# Patient Record
Sex: Female | Born: 1970 | Race: White | Hispanic: No | Marital: Married | Smoking: Never smoker
Health system: Southern US, Community
[De-identification: ages and names within clinical notes are randomized; demographics above are authoritative.]

## PROBLEM LIST (undated history)

## (undated) DIAGNOSIS — K7689 Other specified diseases of liver: Secondary | ICD-10-CM

## (undated) DIAGNOSIS — G43909 Migraine, unspecified, not intractable, without status migrainosus: Secondary | ICD-10-CM

## (undated) DIAGNOSIS — N83209 Unspecified ovarian cyst, unspecified side: Secondary | ICD-10-CM

## (undated) DIAGNOSIS — E781 Pure hyperglyceridemia: Secondary | ICD-10-CM

## (undated) HISTORY — DX: Other specified diseases of liver: K76.89

## (undated) HISTORY — PX: WISDOM TOOTH EXTRACTION: SHX21

## (undated) HISTORY — DX: Unspecified ovarian cyst, unspecified side: N83.209

## (undated) HISTORY — DX: Migraine, unspecified, not intractable, without status migrainosus: G43.909

## (undated) HISTORY — DX: Pure hyperglyceridemia: E78.1

## (undated) SURGERY — Surgical Case
Anesthesia: *Unknown

---

## 1997-12-28 ENCOUNTER — Ambulatory Visit (HOSPITAL_COMMUNITY): Admission: RE | Admit: 1997-12-28 | Discharge: 1997-12-28 | Payer: Self-pay | Admitting: *Deleted

## 1998-02-10 ENCOUNTER — Other Ambulatory Visit: Admission: RE | Admit: 1998-02-10 | Discharge: 1998-02-10 | Payer: Self-pay | Admitting: *Deleted

## 1998-11-24 ENCOUNTER — Inpatient Hospital Stay (HOSPITAL_COMMUNITY): Admission: AD | Admit: 1998-11-24 | Discharge: 1998-11-26 | Payer: Self-pay | Admitting: *Deleted

## 1999-01-02 ENCOUNTER — Other Ambulatory Visit: Admission: RE | Admit: 1999-01-02 | Discharge: 1999-01-02 | Payer: Self-pay | Admitting: *Deleted

## 1999-12-24 ENCOUNTER — Other Ambulatory Visit: Admission: RE | Admit: 1999-12-24 | Discharge: 1999-12-24 | Payer: Self-pay | Admitting: *Deleted

## 2001-01-14 ENCOUNTER — Other Ambulatory Visit: Admission: RE | Admit: 2001-01-14 | Discharge: 2001-01-14 | Payer: Self-pay | Admitting: *Deleted

## 2002-02-08 ENCOUNTER — Other Ambulatory Visit: Admission: RE | Admit: 2002-02-08 | Discharge: 2002-02-08 | Payer: Self-pay | Admitting: *Deleted

## 2002-12-22 ENCOUNTER — Other Ambulatory Visit: Admission: RE | Admit: 2002-12-22 | Discharge: 2002-12-22 | Payer: Self-pay | Admitting: *Deleted

## 2003-12-26 ENCOUNTER — Other Ambulatory Visit: Admission: RE | Admit: 2003-12-26 | Discharge: 2003-12-26 | Payer: Self-pay | Admitting: Obstetrics and Gynecology

## 2005-01-10 ENCOUNTER — Other Ambulatory Visit: Admission: RE | Admit: 2005-01-10 | Discharge: 2005-01-10 | Payer: Self-pay | Admitting: Obstetrics and Gynecology

## 2009-05-27 ENCOUNTER — Encounter: Admission: RE | Admit: 2009-05-27 | Discharge: 2009-05-27 | Payer: Self-pay | Admitting: Diagnostic Neuroimaging

## 2011-05-17 ENCOUNTER — Ambulatory Visit (HOSPITAL_COMMUNITY)
Admission: RE | Admit: 2011-05-17 | Discharge: 2011-05-17 | Disposition: A | Discharge status: Home / Self Care | Payer: BC Managed Care – PPO | Source: Ambulatory Visit | Attending: Obstetrics and Gynecology | Admitting: Obstetrics and Gynecology

## 2011-05-17 ENCOUNTER — Other Ambulatory Visit (HOSPITAL_COMMUNITY): Payer: Self-pay | Admitting: Obstetrics and Gynecology

## 2011-05-17 DIAGNOSIS — D869 Sarcoidosis, unspecified: Secondary | ICD-10-CM

## 2016-11-14 ENCOUNTER — Other Ambulatory Visit: Payer: Self-pay | Admitting: Obstetrics and Gynecology

## 2016-11-14 DIAGNOSIS — R928 Other abnormal and inconclusive findings on diagnostic imaging of breast: Secondary | ICD-10-CM

## 2016-11-15 ENCOUNTER — Ambulatory Visit
Admission: RE | Admit: 2016-11-15 | Discharge: 2016-11-15 | Disposition: A | Discharge status: Home / Self Care | Payer: BLUE CROSS/BLUE SHIELD | Source: Ambulatory Visit | Attending: Obstetrics and Gynecology | Admitting: Obstetrics and Gynecology

## 2016-11-15 ENCOUNTER — Other Ambulatory Visit: Payer: Self-pay | Admitting: Obstetrics and Gynecology

## 2016-11-15 DIAGNOSIS — R928 Other abnormal and inconclusive findings on diagnostic imaging of breast: Secondary | ICD-10-CM

## 2016-11-19 ENCOUNTER — Ambulatory Visit
Admission: RE | Admit: 2016-11-19 | Discharge: 2016-11-19 | Disposition: A | Discharge status: Home / Self Care | Payer: BLUE CROSS/BLUE SHIELD | Source: Ambulatory Visit | Attending: Obstetrics and Gynecology | Admitting: Obstetrics and Gynecology

## 2016-12-11 ENCOUNTER — Emergency Department (HOSPITAL_COMMUNITY)
Admission: EM | Admit: 2016-12-11 | Discharge: 2016-12-11 | Disposition: A | Discharge status: Home / Self Care | Payer: BLUE CROSS/BLUE SHIELD | Attending: Emergency Medicine | Admitting: Emergency Medicine

## 2016-12-11 ENCOUNTER — Encounter (HOSPITAL_COMMUNITY): Payer: Self-pay

## 2016-12-11 DIAGNOSIS — R112 Nausea with vomiting, unspecified: Secondary | ICD-10-CM | POA: Insufficient documentation

## 2016-12-11 DIAGNOSIS — E860 Dehydration: Secondary | ICD-10-CM | POA: Insufficient documentation

## 2016-12-11 DIAGNOSIS — Z79899 Other long term (current) drug therapy: Secondary | ICD-10-CM | POA: Insufficient documentation

## 2016-12-11 DIAGNOSIS — R197 Diarrhea, unspecified: Secondary | ICD-10-CM | POA: Diagnosis not present

## 2016-12-11 LAB — URINALYSIS, ROUTINE W REFLEX MICROSCOPIC
BILIRUBIN URINE: NEGATIVE
Glucose, UA: NEGATIVE mg/dL
Ketones, ur: 20 mg/dL — AB
LEUKOCYTES UA: NEGATIVE
NITRITE: NEGATIVE
PROTEIN: NEGATIVE mg/dL
Specific Gravity, Urine: 1.01 (ref 1.005–1.030)
pH: 5 (ref 5.0–8.0)

## 2016-12-11 LAB — COMPREHENSIVE METABOLIC PANEL
ALBUMIN: 4.2 g/dL (ref 3.5–5.0)
ALT: 20 U/L (ref 14–54)
AST: 31 U/L (ref 15–41)
Alkaline Phosphatase: 54 U/L (ref 38–126)
Anion gap: 11 (ref 5–15)
BUN: 10 mg/dL (ref 6–20)
CHLORIDE: 103 mmol/L (ref 101–111)
CO2: 22 mmol/L (ref 22–32)
CREATININE: 0.87 mg/dL (ref 0.44–1.00)
Calcium: 8.4 mg/dL — ABNORMAL LOW (ref 8.9–10.3)
GFR calc non Af Amer: >60 mL/min (ref >60)
GLUCOSE: 90 mg/dL (ref 65–99)
Potassium: 3.5 mmol/L (ref 3.5–5.1)
SODIUM: 136 mmol/L (ref 135–145)
Total Bilirubin: 1 mg/dL (ref 0.3–1.2)
Total Protein: 7.1 g/dL (ref 6.5–8.1)

## 2016-12-11 LAB — CBC WITH DIFFERENTIAL/PLATELET
BASOS PCT: 0 %
Basophils Absolute: 0 10*3/uL (ref 0.0–0.1)
EOS ABS: 0 10*3/uL (ref 0.0–0.7)
EOS PCT: 0 %
HCT: 39.2 % (ref 36.0–46.0)
Hemoglobin: 13.8 g/dL (ref 12.0–15.0)
Lymphocytes Relative: 2 %
Lymphs Abs: 0.1 10*3/uL — ABNORMAL LOW (ref 0.7–4.0)
MCH: 30.1 pg (ref 26.0–34.0)
MCHC: 35.2 g/dL (ref 30.0–36.0)
MCV: 85.4 fL (ref 78.0–100.0)
MONO ABS: 0.1 10*3/uL (ref 0.1–1.0)
MONOS PCT: 1 %
Neutro Abs: 4.9 10*3/uL (ref 1.7–7.7)
Neutrophils Relative %: 97 %
PLATELETS: 138 10*3/uL — AB (ref 150–400)
RBC: 4.59 MIL/uL (ref 3.87–5.11)
RDW: 12.4 % (ref 11.5–15.5)
WBC: 5.1 10*3/uL (ref 4.0–10.5)

## 2016-12-11 LAB — CBG MONITORING, ED: Glucose-Capillary: 93 mg/dL (ref 65–99)

## 2016-12-11 LAB — LIPASE, BLOOD: LIPASE: 22 U/L (ref 11–51)

## 2016-12-11 LAB — I-STAT BETA HCG BLOOD, ED (MC, WL, AP ONLY)

## 2016-12-11 MED ORDER — ONDANSETRON HCL 4 MG/2ML IJ SOLN
4.0000 mg | Freq: Once | INTRAMUSCULAR | Status: AC
  Administered 2016-12-11: 4 mg via INTRAVENOUS
  Filled 2016-12-11: qty 2

## 2016-12-11 MED ORDER — ONDANSETRON HCL 4 MG/2ML IJ SOLN: 4.0000 mg | Freq: Once | INTRAMUSCULAR | Status: DC | PRN

## 2016-12-11 MED ORDER — ONDANSETRON 4 MG PO TBDP: 4.0000 mg | ORAL_TABLET | Freq: Three times a day (TID) | ORAL | 0 refills | Status: DC | PRN

## 2016-12-11 MED ORDER — SODIUM CHLORIDE 0.9 % IV BOLUS (SEPSIS)
1000.0000 mL | Freq: Once | INTRAVENOUS | Status: AC
  Administered 2016-12-11: 1000 mL via INTRAVENOUS

## 2016-12-11 MED ORDER — KETOROLAC TROMETHAMINE 30 MG/ML IJ SOLN
30.0000 mg | Freq: Once | INTRAMUSCULAR | Status: AC
  Administered 2016-12-11: 30 mg via INTRAVENOUS
  Filled 2016-12-11: qty 1

## 2016-12-11 MED ORDER — DICYCLOMINE HCL 20 MG PO TABS: 20.0000 mg | ORAL_TABLET | Freq: Two times a day (BID) | ORAL | 0 refills | Status: DC

## 2016-12-11 NOTE — ED Notes (Signed)
PT DISCHARGED. INSTRUCTIONS AND PRESCRIPTIONS GIVEN. AAOX4. PT IN NO APPARENT DISTRESS OR PAIN. THE OPPORTUNITY TO ASK QUESTIONS WAS PROVIDED. 

## 2016-12-11 NOTE — ED Provider Notes (Signed)
[facility] DEPT Provider Note   CSN: [id number] Arrival date & time: 12/11/16  1124     History   Chief Complaint Chief Complaint  Patient presents with  . Emesis  . Diarrhea    HPI Grace Beck is a 46 y.o. female.  HPI   Grace Beck is a 46 y.o. female, patient with no pertinent past medical history, presenting to the ED with N/V/D. Nausea beginning two days ago. Vomiting and diarrhea beginning yesterday. Today had sudden onset of multiple emesis and diarrhea with some stool incontinence.  No recent travel. No known contact with persons who have traveled outside the country. No known sick contacts. No recent antibiotic use. No recent hospitalization or contact with hospitalized/nursing home patients. No recent questionable intake. Poor oral intake last two days.   Denies abdominal pain, chest pain, shortness of breath, hematochezia/melena, or any other complaints.      History reviewed. No pertinent past medical history.  There are no active problems to display for this patient.   History reviewed. No pertinent surgical history.  OB History    No data available       Home Medications    Prior to Admission medications   Medication Sig Start Date End Date Taking? Authorizing Provider  calcium gluconate 500 MG tablet Take 1 tablet by mouth 2 (two) times daily.   Yes [provider]  Cholecalciferol (VITAMIN D) 2000 units CAPS Take 2,000 Units by mouth daily.   Yes [provider]  Multiple Vitamins-Minerals (MULTIVITAMIN WITH MINERALS) tablet Take 1 tablet by mouth daily.   Yes [provider]  dicyclomine (BENTYL) 20 MG tablet Take 1 tablet (20 mg total) by mouth 2 (two) times daily. 12/11/16   Joy, Shawn C, PA-C  ondansetron (ZOFRAN ODT) 4 MG disintegrating tablet Take 1 tablet (4 mg total) by mouth every 8 (eight) hours as needed for nausea or vomiting. 12/11/16   Lorayne Bender, PA-C    Family History History reviewed. No  pertinent family history.  Social History Social History  Substance Use Topics  . Smoking status: Never Smoker  . Smokeless tobacco: Never Used  . Alcohol use No     Allergies   Patient has no known allergies.   Review of Systems Review of Systems  Constitutional: Positive for activity change, appetite change, diaphoresis and fever.  Respiratory: Negative for cough and shortness of breath.   Cardiovascular: Negative for chest pain.  Gastrointestinal: Positive for diarrhea, nausea and vomiting. Negative for abdominal pain and blood in stool.  Genitourinary: Negative for difficulty urinating.  Skin: Positive for pallor.  Neurological: Positive for weakness (generalized) and light-headedness. Negative for syncope.  All other systems reviewed and are negative.    Physical Exam Updated Vital Signs BP 117/74 (BP Location: Left Arm)   Pulse (!) 121   Temp (!) 100.8 F (38.2 C) (Oral)   Resp 17   Ht 5\' 2"  (1.575 m)   Wt 56.2 kg (124 lb)   SpO2 97%   BMI 22.68 kg/m   Physical Exam  Constitutional: She appears well-developed and well-nourished.  HENT:  Head: Normocephalic and atraumatic.  Eyes: Conjunctivae are normal.  Neck: Neck supple.  Cardiovascular: Regular rhythm, normal heart sounds and intact distal pulses.  Tachycardia present.   Pulmonary/Chest: Effort normal and breath sounds normal. No respiratory distress.  Abdominal: Soft. Bowel sounds are increased. There is no tenderness. There is no guarding.  Musculoskeletal: She exhibits no edema.  Lymphadenopathy:  She has no cervical adenopathy.  Neurological: She is alert.  Skin: Skin is warm and dry. She is not diaphoretic. There is pallor.  Psychiatric: She has a normal mood and affect. Her behavior is normal.  Nursing note and vitals reviewed.    ED Treatments / Results  Labs (all labs ordered are listed, but only abnormal results are displayed) Labs Reviewed  COMPREHENSIVE METABOLIC PANEL - Abnormal;  Notable for the following:       Result Value   Calcium 8.4 (*)    All other components within normal limits  URINALYSIS, ROUTINE W REFLEX MICROSCOPIC - Abnormal; Notable for the following:    APPearance HAZY (*)    Hgb urine dipstick SMALL (*)    Ketones, ur 20 (*)    Bacteria, UA RARE (*)    Squamous Epithelial / LPF 0-5 (*)    All other components within normal limits  CBC WITH DIFFERENTIAL/PLATELET - Abnormal; Notable for the following:    Platelets 138 (*)    Lymphs Abs 0.1 (*)    All other components within normal limits  GASTROINTESTINAL PANEL BY PCR, STOOL (REPLACES STOOL CULTURE)  LIPASE, BLOOD  I-STAT BETA HCG BLOOD, ED ([facility], [facility], [facility] ONLY)  CBG MONITORING, ED    EKG  EKG Interpretation None       Radiology No results found.  Procedures Procedures (including critical care time)  Medications Ordered in ED Medications  sodium chloride 0.9 % bolus 1,000 mL (0 mLs Intravenous Stopped 12/11/16 1736)    Followed by  sodium chloride 0.9 % bolus 1,000 mL (0 mLs Intravenous Stopped 12/11/16 1736)  ondansetron (ZOFRAN) injection 4 mg (4 mg Intravenous Given 12/11/16 1400)  ketorolac (TORADOL) 30 MG/ML injection 30 mg (30 mg Intravenous Given 12/11/16 1400)     Initial Impression / Assessment and Plan / ED Course  I have reviewed the triage vital signs and the nursing notes.  Pertinent labs & imaging results that were available during my care of the patient were reviewed by me and considered in my medical decision making (see chart for details).  Clinical Course as of Dec 12 2235  Wed Dec 11, 2016  1515 Patient states she is beginning to feel better. No instances of vomiting or diarrhea since initiation of treatment.  [SJ]  6789 Patient states her normal SBP is in the low to mid 90s. BP: (!) 99/59 [SJ]  1655 Patient's nausea has resolved completely. Continues to deny instances of vomiting or diarrhea here in the ED. Tolerating PO fluids.   [SJ]    Clinical Course User  Index [SJ] Joy, Shawn C, PA-C    Patient presents with nausea, vomiting, and diarrhea. Evidence of dehydration on UA. Significant improvement voiced by patient and observed on repeat evaluations. Improvement in patient's tachycardia during ED course. Able to tolerate PO. Patient is an otherwise healthy female with no risk factors and thus shared decision making was used regarding discharge with continued PO hydration at home. Ambulated without difficulty or assistance. PCP follow up as needed. The patient and patient's husband at the bedside were given instructions for home care as well as strict return precautions. Both parties voice understanding of these instructions, accept the plan, and are comfortable with discharge.  Vitals:   12/11/16 1400 12/11/16 1500 12/11/16 1523 12/11/16 1700  BP: 106/63 (!) 92/56 (!) 99/59 (!) 99/53  Pulse: (!) 124 (!) 115 (!) 114 (!) 111  Resp: (!) 22 17 18  (!) 22  Temp:   99.2  F (37.3 C)   TempSrc:   Oral   SpO2: 95% 90% 92% 96%  Weight:      Height:         Final Clinical Impressions(s) / ED Diagnoses   Final diagnoses:  Nausea vomiting and diarrhea  Dehydration    New Prescriptions Discharge Medication List as of 12/11/2016  5:23 PM    START taking these medications   Details  dicyclomine (BENTYL) 20 MG tablet Take 1 tablet (20 mg total) by mouth 2 (two) times daily., Starting Wed 12/11/2016, Print    ondansetron (ZOFRAN ODT) 4 MG disintegrating tablet Take 1 tablet (4 mg total) by mouth every 8 (eight) hours as needed for nausea or vomiting., Starting Wed 12/11/2016, Print         Joy, Burke C, PA-C 12/11/16 2239    Tegeler, Gwenyth Allegra, MD 12/12/16 [phone]

## 2016-12-11 NOTE — Discharge Instructions (Signed)
Your lab results are encouraging overall, but with some signs of dehydration.   Hand washing: Wash your hands throughout the day, but especially before and after touching the face, using the restroom, sneezing, coughing, or touching surfaces that have been coughed or sneezed upon. Hydration: Symptoms will be intensified and complicated by dehydration. Dehydration can also extend the duration of symptoms. Drink plenty of fluids and get plenty of rest. You should be drinking at least half a liter of water an hour to stay hydrated. Electrolyte drinks are also encouraged. You should be drinking enough fluids to make your urine light yellow, almost clear. If this is not the case, you are not drinking enough water. Please note that some of the treatments indicated below will not be effective if you are not adequately hydrated. Pain or fever: Ibuprofen, Naproxen, or Tylenol for pain or fever.  Nausea/vomiting: Use the Zofran for nausea or vomiting.  Bentyl: Bentyl can be used as needed for abdominal cramping. Follow up: Follow up with a primary care provider, as needed, for any future management of this issue. Return: Return to the ED should any symptoms worsen or new concerning symptoms develop.

## 2016-12-11 NOTE — ED Triage Notes (Signed)
PT RECEIVED VIA EMS FOR N/V/D. PER EMS, PT WAS ATTENDING A [employer] ORIENTATION WITH HER SON, WHEN SHE BECAME DIZZY AND LIGHT HEADED AND BEGAN TO VOMIT WITH DIARRHEA. PT STS SHE HAD AN EPISODE OF DIARRHEA THIS AM BEFORE GOING. DURING THE EPISODE, SHE ALSO BEGAN TO HYPERVENTILATE.

## 2016-12-17 ENCOUNTER — Ambulatory Visit
Admission: RE | Admit: 2016-12-17 | Discharge: 2016-12-17 | Disposition: A | Discharge status: Home / Self Care | Payer: BLUE CROSS/BLUE SHIELD | Source: Ambulatory Visit | Attending: Family Medicine | Admitting: Family Medicine

## 2016-12-17 ENCOUNTER — Encounter: Payer: Self-pay | Admitting: Family Medicine

## 2016-12-17 ENCOUNTER — Ambulatory Visit (INDEPENDENT_AMBULATORY_CARE_PROVIDER_SITE_OTHER): Payer: BLUE CROSS/BLUE SHIELD | Admitting: Family Medicine

## 2016-12-17 VITALS — BP 118/88 | HR 80 | Temp 98.0°F | Resp 14 | Ht 62.0 in | Wt 122.0 lb

## 2016-12-17 DIAGNOSIS — R519 Headache, unspecified: Secondary | ICD-10-CM

## 2016-12-17 DIAGNOSIS — R51 Headache: Principal | ICD-10-CM

## 2016-12-17 MED ORDER — HYDROCODONE-ACETAMINOPHEN 7.5-325 MG PO TABS: 1.0000 | ORAL_TABLET | Freq: Four times a day (QID) | ORAL | 0 refills | Status: DC | PRN

## 2016-12-17 MED ORDER — PROMETHAZINE HCL 25 MG PO TABS: 25.0000 mg | ORAL_TABLET | Freq: Three times a day (TID) | ORAL | 0 refills | Status: DC | PRN

## 2016-12-17 MED ORDER — KETOROLAC TROMETHAMINE 60 MG/2ML IM SOLN
60.0000 mg | Freq: Once | INTRAMUSCULAR | Status: AC
  Administered 2016-12-17: 60 mg via INTRAMUSCULAR

## 2016-12-17 MED ORDER — PROMETHAZINE HCL 25 MG/ML IJ SOLN
25.0000 mg | Freq: Once | INTRAMUSCULAR | Status: AC
  Administered 2016-12-17: 25 mg via INTRAMUSCULAR

## 2016-12-17 NOTE — Progress Notes (Signed)
Subjective:    Patient ID: Grace Beck, female    DOB: 02-28-1971, 46 y.o.   MRN: [id number]  HPI Patient is a 46 y/o WF with PMH of migraines.  They occur seldomly and infrequently.  Approximately 2 weeks ago she was bitten on the left lower quadrant by a tick. There is no visible rash in that area. 9 days ago, the patient became nauseated with severe headache. She did not experience any vomiting. She did not experience any diarrhea. Headache was frontal in location and pressure-like in intensity. Gradually worsened throughout the week. On Wednesday, the patient became extremely nauseated with a severe headache. Went to the restroom where she became tremulous experience lightheadedness felt faint and almost passed out. EMS was called. En route to the hospital, the patient experienced uncontrollable vomiting and diarrhea. Was seen at the hospital and was given IV fluids and symptoms improved. On Thursday, the severe headache and the nausea had returned. She continues to have a daily constant intractable headache. It is frontal in location. She is now reporting some stiffness in her neck which she describes like a crick in her neck. When the headache worsens, she becomes extremely nauseated. This morning the headache was so severe she began vomiting again. No aura. She denies any photophobia.  She denies any seizure-like activity. She denies any neurologic deficit. She denies any rash. Headache and nausea are definitely linked together. She was started on doxycycline by another physician yesterday. CBC was unremarkable. CMP was significant only for a slight elevation and ALT. [facility] spotted fever titers are pending. No past medical history on file. No past surgical history on file. Current Outpatient Prescriptions on File Prior to Visit  Medication Sig Dispense Refill  . ondansetron (ZOFRAN ODT) 4 MG disintegrating tablet Take 1 tablet (4 mg total) by mouth every 8 (eight) hours as needed  for nausea or vomiting. 20 tablet 0  . calcium gluconate 500 MG tablet Take 1 tablet by mouth 2 (two) times daily.    . Cholecalciferol (VITAMIN D) 2000 units CAPS Take 2,000 Units by mouth daily.    Marland Kitchen dicyclomine (BENTYL) 20 MG tablet Take 1 tablet (20 mg total) by mouth 2 (two) times daily. (Patient not taking: Reported on 12/17/2016) 20 tablet 0  . Multiple Vitamins-Minerals (MULTIVITAMIN WITH MINERALS) tablet Take 1 tablet by mouth daily.     No current facility-administered medications on file prior to visit.    No Known Allergies Social History   Social History  . Marital status: Married    Spouse name: N/A  . Number of children: N/A  . Years of education: N/A   Occupational History  . Not on file.   Social History Main Topics  . Smoking status: Never Smoker  . Smokeless tobacco: Never Used  . Alcohol use No  . Drug use: No  . Sexual activity: Not on file   Other Topics Concern  . Not on file   Social History Narrative  . No narrative on file      Review of Systems  All other systems reviewed and are negative.      Objective:   Physical Exam  Constitutional: She is oriented to person, place, and time. She appears well-developed. She is active.  Non-toxic appearance. She does not have a sickly appearance. She appears ill.  HENT:  Right Ear: Tympanic membrane and ear canal normal.  Left Ear: Tympanic membrane and ear canal normal.  Nose: No mucosal edema or rhinorrhea.  Right sinus exhibits frontal sinus tenderness. Left sinus exhibits frontal sinus tenderness.  Eyes: Conjunctivae and EOM are normal. Pupils are equal, round, and reactive to light.  Cardiovascular: Normal rate, regular rhythm and normal heart sounds.   Pulmonary/Chest: Effort normal and breath sounds normal. No respiratory distress. She has no wheezes. She has no rales.  Abdominal: Soft. Bowel sounds are normal. She exhibits no distension. There is no tenderness. There is no rebound and no  guarding.  Neurological: She is alert and oriented to person, place, and time. She has normal reflexes. She displays normal reflexes. No cranial nerve deficit. She exhibits normal muscle tone. Coordination normal.  Vitals reviewed.         Assessment & Plan:  Acute intractable headache, unspecified headache type - Plan: CT Head Wo Contrast  Differential diagnosis includes status migrainosus (however this would not explain the diarrhea or the fevers that she experienced on Wednesday and Thursday), tickborne illness like [facility] spotted fever and she is already on doxycycline, viral meningitis, or causes of increased intracranial pressure. I will obtain a noncontrast CT scan to rule out causes of increased intracranial pressure. I will treat the patient with Phenergan 25 mg every 6 hours scheduled to help cover for migraines as well as control her nausea. I will give her hydrocodone 5/325 one by mouth every 4 hours when necessary headache. I'll allow 24-48 hours to see if doxycycline will improve the condition. Overweight [place] spotted fever titers. Viral meningitis should resolve spontaneously and require supportive care. In addition, patient will receive 60 mg of Toradol IM and 25 mg of Phenergan IM 1 now

## 2016-12-17 NOTE — Addendum Note (Signed)
Addended by: Shary Decamp B on: 12/17/2016 02:36 PM   Modules accepted: Orders

## 2016-12-19 ENCOUNTER — Other Ambulatory Visit: Payer: Self-pay | Admitting: Family Medicine

## 2016-12-23 ENCOUNTER — Other Ambulatory Visit: Payer: Self-pay | Admitting: Family Medicine

## 2016-12-23 ENCOUNTER — Ambulatory Visit (HOSPITAL_COMMUNITY)
Admission: RE | Admit: 2016-12-23 | Discharge: 2016-12-23 | Disposition: A | Discharge status: Home / Self Care | Payer: BLUE CROSS/BLUE SHIELD | Source: Ambulatory Visit | Attending: Family Medicine | Admitting: Family Medicine

## 2016-12-23 ENCOUNTER — Encounter: Payer: Self-pay | Admitting: Family Medicine

## 2016-12-23 ENCOUNTER — Ambulatory Visit (INDEPENDENT_AMBULATORY_CARE_PROVIDER_SITE_OTHER): Payer: BLUE CROSS/BLUE SHIELD | Admitting: Family Medicine

## 2016-12-23 VITALS — BP 128/72 | HR 78 | Temp 98.6°F | Resp 14 | Ht 62.0 in | Wt 124.0 lb

## 2016-12-23 DIAGNOSIS — N83201 Unspecified ovarian cyst, right side: Secondary | ICD-10-CM | POA: Insufficient documentation

## 2016-12-23 DIAGNOSIS — G43A1 Cyclical vomiting, intractable: Secondary | ICD-10-CM | POA: Diagnosis present

## 2016-12-23 DIAGNOSIS — G43A0 Cyclical vomiting, not intractable: Secondary | ICD-10-CM

## 2016-12-23 DIAGNOSIS — R1115 Cyclical vomiting syndrome unrelated to migraine: Secondary | ICD-10-CM

## 2016-12-23 DIAGNOSIS — K7689 Other specified diseases of liver: Secondary | ICD-10-CM | POA: Diagnosis not present

## 2016-12-23 LAB — COMPREHENSIVE METABOLIC PANEL
ALK PHOS: 54 U/L (ref 33–115)
ALT: 21 U/L (ref 6–29)
AST: 18 U/L (ref 10–30)
Albumin: 4.2 g/dL (ref 3.6–5.1)
BUN: 9 mg/dL (ref 7–25)
CALCIUM: 9.1 mg/dL (ref 8.6–10.2)
CHLORIDE: 104 mmol/L (ref 98–110)
CO2: 26 mmol/L (ref 20–31)
Creat: 0.72 mg/dL (ref 0.50–1.10)
GLUCOSE: 84 mg/dL (ref 70–99)
POTASSIUM: 4.5 mmol/L (ref 3.5–5.3)
Sodium: 136 mmol/L (ref 135–146)
Total Bilirubin: 0.4 mg/dL (ref 0.2–1.2)
Total Protein: 7.1 g/dL (ref 6.1–8.1)

## 2016-12-23 LAB — CBC
HEMATOCRIT: 42.4 % (ref 35.0–45.0)
Hemoglobin: 15 g/dL (ref 12.0–15.0)
MCH: 30.9 pg (ref 27.0–33.0)
MCHC: 35.4 g/dL (ref 32.0–36.0)
MCV: 87.2 fL (ref 80.0–100.0)
PLATELETS: 222 10*3/uL (ref 140–400)
RBC: 4.86 MIL/uL (ref 3.80–5.10)
RDW: 12.7 % (ref 11.0–15.0)
WBC: 6.2 10*3/uL (ref 3.8–10.8)

## 2016-12-23 LAB — LIPASE: LIPASE: 46 U/L (ref 7–60)

## 2016-12-23 MED ORDER — IOPAMIDOL (ISOVUE-300) INJECTION 61%
INTRAVENOUS | Status: AC
  Administered 2016-12-23: 80 mL
  Filled 2016-12-23: qty 100

## 2016-12-23 MED ORDER — IOPAMIDOL (ISOVUE-300) INJECTION 61%
INTRAVENOUS | Status: AC
  Filled 2016-12-23: qty 30

## 2016-12-23 MED ORDER — METOCLOPRAMIDE HCL 5 MG PO TABS: 5.0000 mg | ORAL_TABLET | Freq: Three times a day (TID) | ORAL | 0 refills | Status: DC

## 2016-12-23 NOTE — Progress Notes (Signed)
   Subjective:    Patient ID: Grace Beck, female    DOB: 1970-07-28, 46 y.o.   MRN: [id number]  Patient presents for Nausea (N/V x3 weeks)   Pt here with N/V x 2 weeks, seen last week for N/V headaches. She had episode of presyncopy as well and EMS was called sent to ED 6/6 with uncontrollable N/V and diarrhea. She was given IV fluids.    Seen after ER visit, she had been started on doxycycline by her ( OB/GYN- she works for [employer]), with labs pending for possible tick borne illness. She had CT head (which was negative) due to severe headaches persisting, given Norco 5-325mg  in addition to her nausea medication. She was also given toradol and phenergan in the office.   DD at that time per notes- Tick borne illness, vs status migrainnosus, vs viral meningitis/Syndrome  . Her headache resolved last wed, Diarrhea resolved on 6/8. She had nausea through this past weekend but was able to eat until this AM when she began vomiting severely again- had 4 large emesis in a row. Unknown whenlast BM was thinks about 3 days ago. Minimal abdominal pain only with vomiting, otherwise okay.  She had US done at work this morning, she works in [employer] office, no gallstones seen, gas overlying pancreas,. She had simple cyst noted on her liver   Has IUD NOT pregnant   Titers for tick born illness are NEGATIVE  Review Of Systems:  GEN- denies fatigue, fever, weight loss,weakness, recent illness HEENT- denies eye drainage, change in vision, nasal discharge, CVS- denies chest pain, palpitations RESP- denies SOB, cough, wheeze ABD- + N/V, change in stools, abd pain GU- denies dysuria, hematuria, dribbling, incontinence MSK- denies joint pain, muscle aches, injury Neuro- denies headache, dizziness, syncope, seizure activity       Objective:    BP 128/72   Pulse 78   Temp 98.6 F (37 C) (Oral)   Resp 14   Ht 5\' 2"  (1.575 m)   Wt 124 lb (56.2 kg)   SpO2 99%   BMI 22.68 kg/m  GEN- NAD,  alert and oriented x3 HEENT- PERRL, EOMI, non injected sclera, pink conjunctiva, MMM, oropharynx clear Neck- Supple, no LAD CVS- RRR, no murmur RESP-CTAB ABD-hypacotive BS,soft,NT,ND NEURO-CNII-XII intact  EXT- No edema Pulses- Radia  2+        Assessment & Plan:      Problem List Items Addressed This Visit    None    Visit Diagnoses    Intractable cyclical vomiting with nausea    -  Primary   Continued vomiting episodes, hypoactive bowels, ? ileus vs  partial SBO, doubt gastroenteritis this long, tick born illness Neg.no reproducible pain. [place] neg for stones in gallbadder. Has already taken zofran Next step CT abdomen.  Repeat CMET/Lipase/CBC No sign of menginitis, headache as resolved, ? Related to doxy or not, though titers neg, but will complete 10 days.      Relevant Orders   CT ABDOMEN W CONTRAST   CBC   Comprehensive metabolic panel   Lipase      Note: This dictation was prepared with Dragon dictation along with smaller phrase technology. Any transcriptional errors that result from this process are unintentional.

## 2016-12-23 NOTE — Addendum Note (Signed)
Addended by: Vic Blackbird F on: 12/23/2016 01:03 PM   Modules accepted: Orders

## 2016-12-23 NOTE — Patient Instructions (Signed)
F/U pending CT scan

## 2016-12-24 ENCOUNTER — Ambulatory Visit (HOSPITAL_COMMUNITY): Payer: BLUE CROSS/BLUE SHIELD

## 2016-12-25 ENCOUNTER — Encounter: Payer: Self-pay | Admitting: Gastroenterology

## 2017-02-06 ENCOUNTER — Ambulatory Visit: Payer: BLUE CROSS/BLUE SHIELD | Admitting: Gastroenterology

## 2019-06-08 IMAGING — CT CT ABD-PELV W/ CM
2 of 5 series · 16 of 46 positions shown, 18 images · IV contrast (ISOVUE 300)
Comparison: None.

CLINICAL DATA: Intractable intermittent vomiting and nausea.
Concern for bowel obstruction or ileus

EXAM:
CT ABDOMEN AND PELVIS WITH CONTRAST
TECHNIQUE: Multidetector CT imaging of the abdomen and pelvis was performed
using the standard protocol following bolus administration of
intravenous contrast.
CONTRAST:  80mL 5Q1H5J-LUU IOPAMIDOL ([id number]-LUU) INJECTION 61%

[Series 2: axial st · axial · 0.67mm/px · z∈[-562,-178]mm · 13 of 89 slices shown, 15 images]
[im 6/89  soft-tissue]
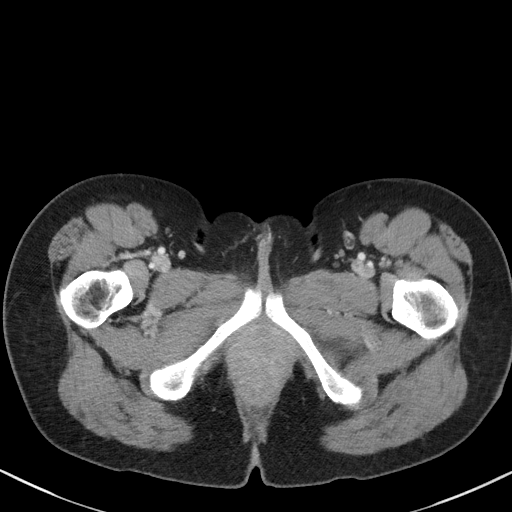
[im 6/89  bone]
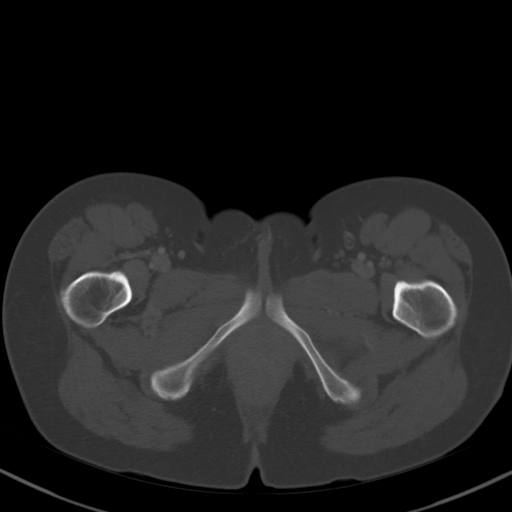
[im 12/89  soft-tissue]
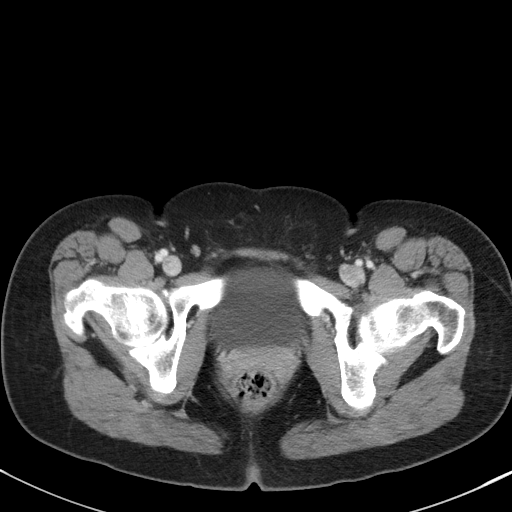
[im 18/89  soft-tissue]
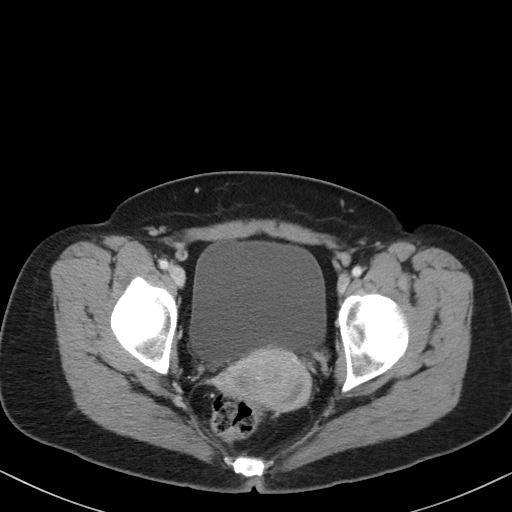
[im 24/89  soft-tissue]
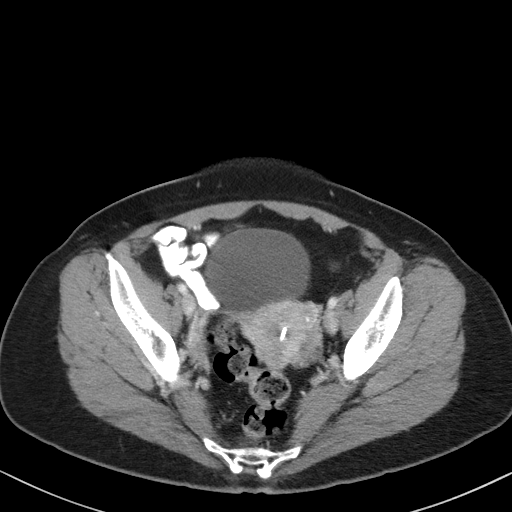
[im 30/89  soft-tissue]
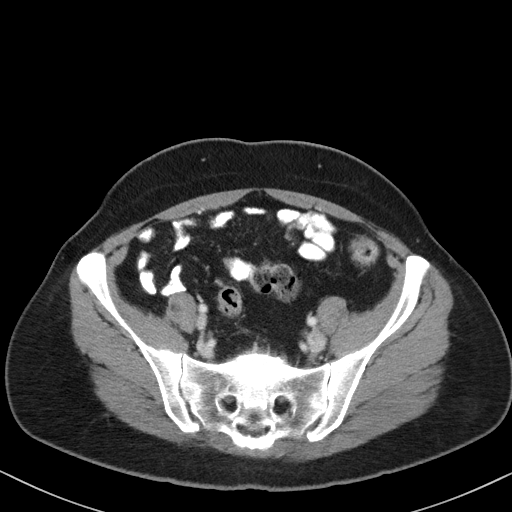
[im 36/89  soft-tissue]
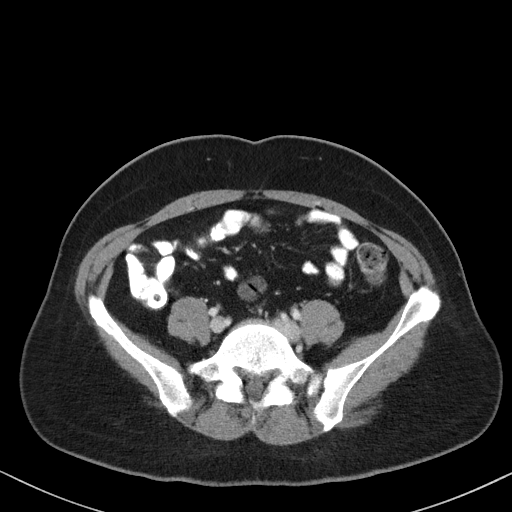
[im 47/89  soft-tissue]
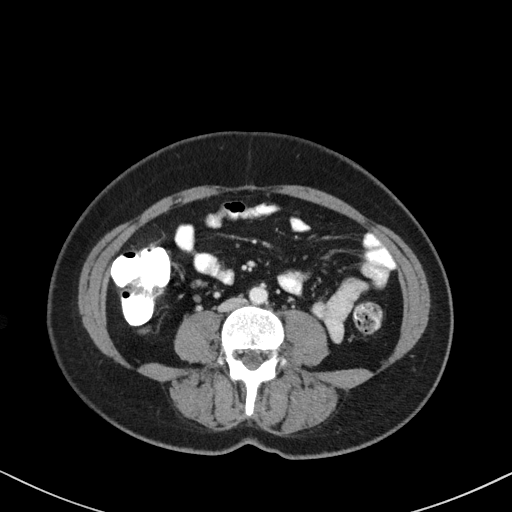
[im 53/89  soft-tissue]
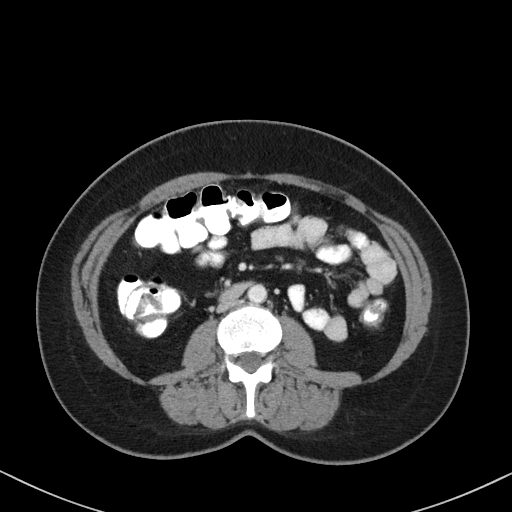
[im 59/89  soft-tissue]
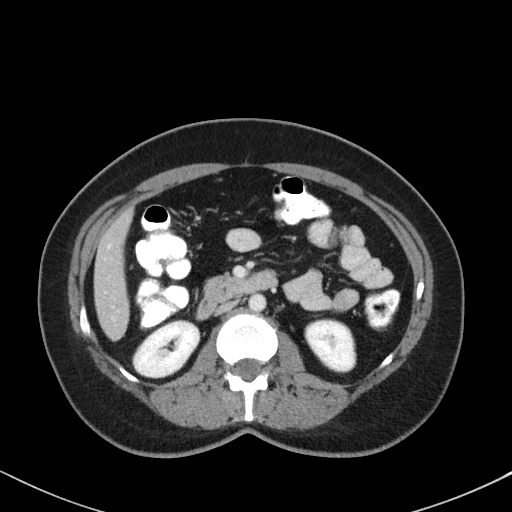
[im 59/89  bone]
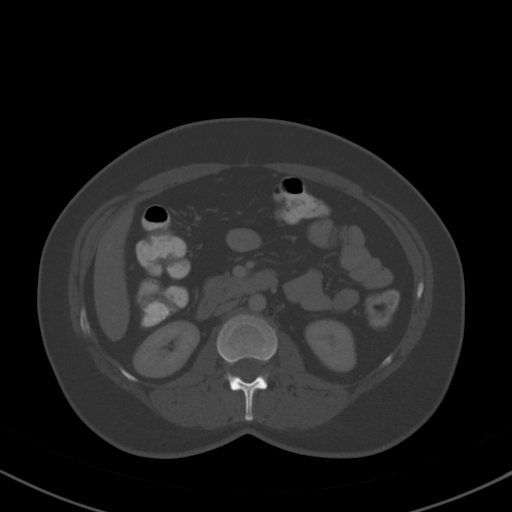
[im 65/89  soft-tissue]
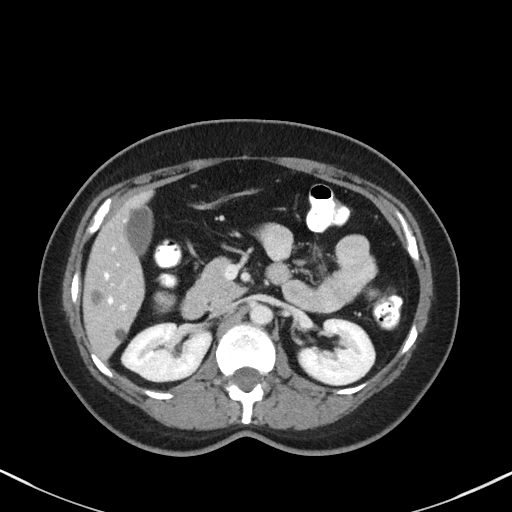
[im 71/89  soft-tissue]
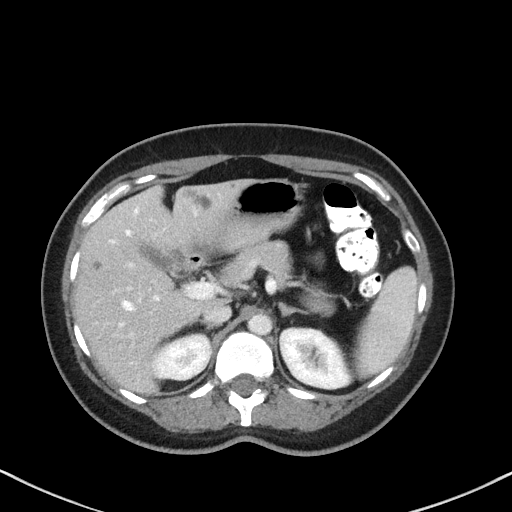
[im 77/89  soft-tissue]
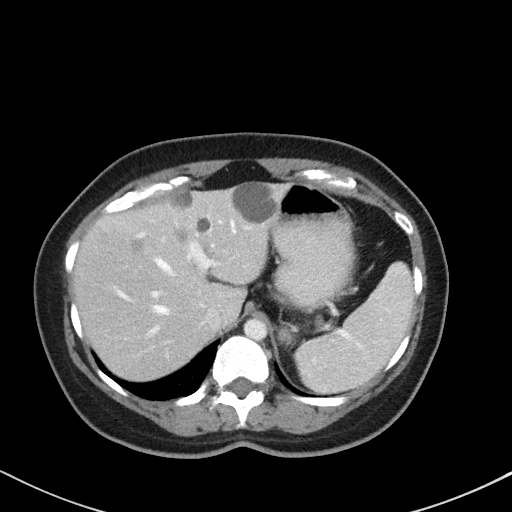
[im 83/89  soft-tissue]
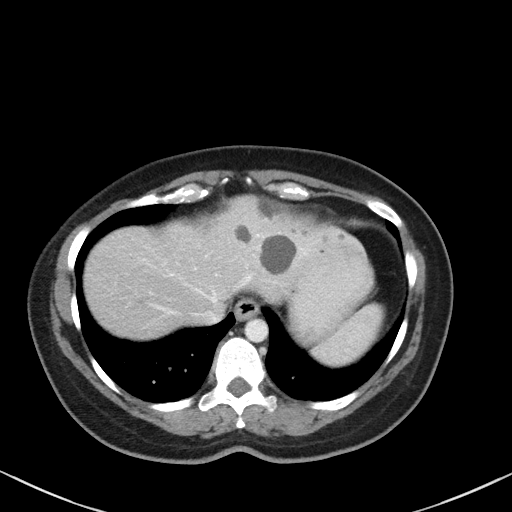

[Series 4: coronal st · coronal · 0.71mm/px · 3 of 73 slices shown]
[im 25/73  soft-tissue]
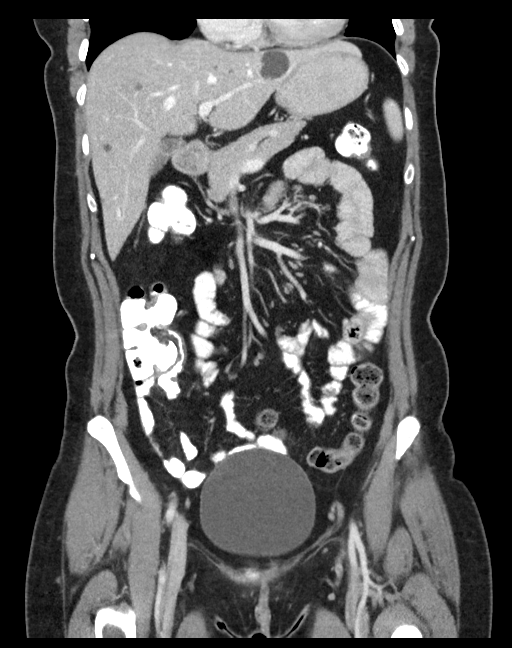
[im 33/73  soft-tissue]
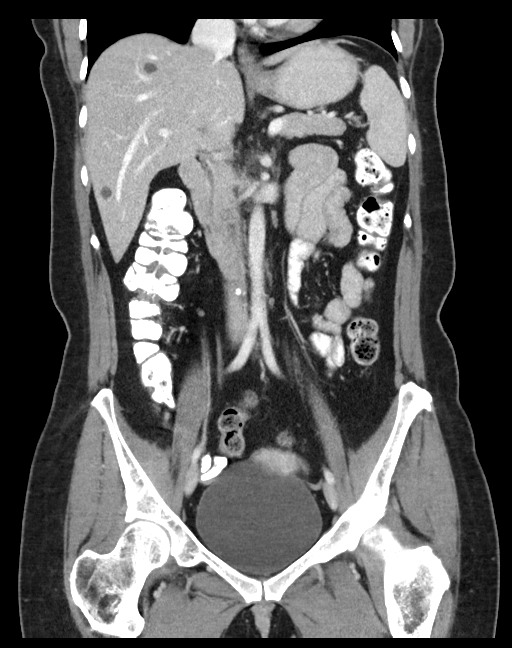
[im 41/73  soft-tissue]
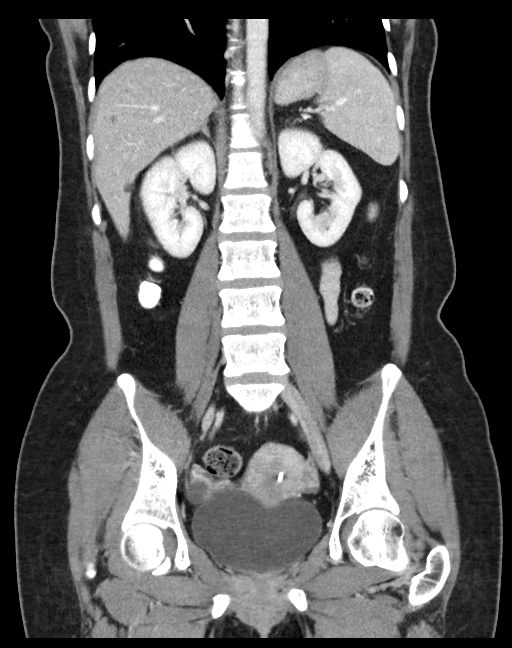

[16 of 46 positions shown; findings below may reference images not displayed]

FINDINGS: Lower chest: No acute abnormality.

Hepatobiliary: Numerous hypodense hepatic cysts throughout the
liver. Largest in the left hepatic lobe laterally measures 3.3 x
cm, image 12. No biliary dilatation. Patent hepatic and portal
veins. Gallbladder and biliary system unremarkable.

Pancreas: Unremarkable. No pancreatic ductal dilatation or
surrounding inflammatory changes.

Spleen: Normal in size without focal abnormality.

Adrenals/Urinary Tract: Adrenal glands are unremarkable. Kidneys are
normal, without renal calculi, focal lesion, or hydronephrosis.
Bladder is unremarkable.

Stomach/Bowel: Stomach is within normal limits. Appendix appears
normal. No evidence of bowel wall thickening, distention, or
inflammatory changes. No fluid collection or abscess.

Vascular/Lymphatic: No significant vascular findings are present. No
enlarged abdominal or pelvic lymph nodes.

Reproductive: IUD within the endometrial cavity. Small hypodense
right ovarian cyst measures 2.3 cm, image 69. No pelvic free fluid
or fluid collection. No ascites.

Other: Intact abdominal wall.  Negative for hernia.

Musculoskeletal: No acute or significant osseous findings.
IMPRESSION: No acute intra-abdominal or pelvic finding. Negative for bowel
obstruction or ileus.

Numerous hepatic cysts measuring up to 3.3 cm in diameter.

Small right ovarian 2.3 cm cyst

IUD within the endometrial cavity

## 2020-01-05 DIAGNOSIS — M8580 Other specified disorders of bone density and structure, unspecified site: Secondary | ICD-10-CM | POA: Insufficient documentation

## 2020-01-15 ENCOUNTER — Encounter (HOSPITAL_BASED_OUTPATIENT_CLINIC_OR_DEPARTMENT_OTHER): Payer: Self-pay | Admitting: Emergency Medicine

## 2020-01-15 ENCOUNTER — Other Ambulatory Visit: Payer: Self-pay

## 2020-01-15 ENCOUNTER — Emergency Department (HOSPITAL_BASED_OUTPATIENT_CLINIC_OR_DEPARTMENT_OTHER)
Admission: EM | Admit: 2020-01-15 | Discharge: 2020-01-15 | Disposition: A | Discharge status: Home / Self Care | Payer: BC Managed Care – PPO | Attending: Emergency Medicine | Admitting: Emergency Medicine

## 2020-01-15 DIAGNOSIS — H539 Unspecified visual disturbance: Secondary | ICD-10-CM | POA: Insufficient documentation

## 2020-01-15 MED ORDER — TETRACAINE HCL 0.5 % OP SOLN
2.0000 [drp] | Freq: Once | OPHTHALMIC | Status: AC
  Administered 2020-01-15: 2 [drp] via OPHTHALMIC
  Filled 2020-01-15: qty 4

## 2020-01-15 NOTE — ED Provider Notes (Signed)
Emergency Department Provider Note   I have reviewed the triage vital signs and the nursing notes.   HISTORY  Chief Complaint Eye Problem   HPI Grace Beck is a 49 y.o. female w/ PMH of migraine HA presents to the ED with acute onset, painless vision disturbance. Patient was at home doing chores when she noticed "floaters" in her vision. She had symptoms in both eyes with no alleviating factors. She was able to see color and shape but tells me it looked like she was looking through something that was distorting the image. No black or curtain like disturbance. No flashes in vision. No HA. No head injury. No eye pain or drainage. No similar symptoms in the past. She does not wear contact lenses.   Past Medical History:  Diagnosis Date  . Hepatic cyst   . Migraines   . Ovarian cyst     There are no problems to display for this patient.   History reviewed. No pertinent surgical history.  Allergies Patient has no known allergies.  History reviewed. No pertinent family history.  Social History Social History   Tobacco Use  . Smoking status: Never Smoker  . Smokeless tobacco: Never Used  Substance Use Topics  . Alcohol use: No  . Drug use: No    Review of Systems  Constitutional: No fever/chills Eyes: Positive visual changes. ENT: No sore throat. Cardiovascular: Denies chest pain. Respiratory: Denies shortness of breath. Gastrointestinal: No abdominal pain.  No nausea, no vomiting.  No diarrhea.  No constipation. Genitourinary: Negative for dysuria. Musculoskeletal: Negative for back pain. Skin: Negative for rash. Neurological: Negative for headaches, focal weakness or numbness.  10-point ROS otherwise negative.  ____________________________________________   PHYSICAL EXAM:  VITAL SIGNS: ED Triage Vitals  Enc Vitals Group     BP 01/15/20 2044 (!) 150/99     Pulse Rate 01/15/20 2044 100     Resp 01/15/20 2044 14     Temp 01/15/20 2044 98.4 F  (36.9 C)     Temp Source 01/15/20 2044 Oral     SpO2 01/15/20 2044 100 %     Weight 01/15/20 2042 123 lb 14.4 oz (56.2 kg)   Constitutional: Alert and oriented. Well appearing and in no acute distress. Eyes: Conjunctivae are normal. PERRL. EOMI. Normal fundoscopic exam. IOP 16 R and 19 L.  Head: Atraumatic. Nose: No congestion/rhinnorhea. Mouth/Throat: Mucous membranes are moist.  Neck: No stridor.   Cardiovascular: Normal rate, regular rhythm.  Respiratory: Normal respiratory effort. Gastrointestinal: No distention.  Musculoskeletal: No gross deformities of extremities. Neurologic:  Normal speech and language. No facial asymmetry.  Skin:  Skin is warm, dry and intact. No rash noted.   ____________________________________________   PROCEDURES  Procedure(s) performed:   Procedures  None  ____________________________________________   INITIAL IMPRESSION / ASSESSMENT AND PLAN / ED COURSE  Pertinent labs & imaging results that were available during my care of the patient were reviewed by me and considered in my medical decision making (see chart for details).   Patient with painless bilateral vision change that has since resolved. No neuro symptoms to suspect TIA. No eye pain. Normal pressures. No evidence of acute glaucoma. No historical features to suspect retinal detachment with bilateral symptoms. Spoke with Ophthalmology on call, Dr. Eulas Post. 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 They will f/u in the office. Patient's vision normal here. Discussed f/u plan and ED return precautions. Patient is comfortable with the plan at discharge.    ____________________________________________  FINAL CLINICAL IMPRESSION(S) / ED DIAGNOSES  Final diagnoses:  Vision disturbance    MEDICATIONS GIVEN DURING THIS VISIT:  Medications  tetracaine (PONTOCAINE) 0.5 % ophthalmic solution 2 drop (2 drops Both Eyes Given by Other 01/15/20 2145)    Note:  This document was prepared  using Dragon voice recognition software and may include unintentional dictation errors.  Nanda Quinton, MD, [facility]    Wessie Shanks, [facility], MD 01/19/20 1016

## 2020-01-15 NOTE — ED Triage Notes (Signed)
Patient states that she was cleaning and at 1830 she started to have floaters and then loose vision in her bilateral vision  the L>R

## 2020-01-15 NOTE — Discharge Instructions (Signed)
You were seen in the emergency room today with vision disturbance.  Your eye exam here is reassuring.  I have spoken with Dr. Eulas Post and he would like to see you in the office on Monday for a dilated eye exam.  It is likely that this is an ocular type migraine even though you do not have headache.  You need to have an eye exam to make sure that your eyes look healthy.  Please call the office on Monday and I will see you in the office then.  Return to the emergency department any new or suddenly worsening symptoms.

## 2021-07-19 ENCOUNTER — Other Ambulatory Visit: Payer: Self-pay

## 2021-07-19 ENCOUNTER — Ambulatory Visit: Payer: BC Managed Care – PPO | Admitting: Family Medicine

## 2021-07-19 ENCOUNTER — Encounter: Payer: Self-pay | Admitting: Family Medicine

## 2021-07-19 VITALS — BP 118/72 | HR 90 | Temp 97.8°F | Resp 18 | Ht 62.0 in | Wt 126.0 lb

## 2021-07-19 DIAGNOSIS — J029 Acute pharyngitis, unspecified: Secondary | ICD-10-CM

## 2021-07-19 DIAGNOSIS — J0110 Acute frontal sinusitis, unspecified: Secondary | ICD-10-CM | POA: Diagnosis not present

## 2021-07-19 DIAGNOSIS — R5383 Other fatigue: Secondary | ICD-10-CM | POA: Diagnosis not present

## 2021-07-19 MED ORDER — AMOXICILLIN-POT CLAVULANATE 875-125 MG PO TABS: 1.0000 | ORAL_TABLET | Freq: Two times a day (BID) | ORAL | 0 refills | Status: DC

## 2021-07-19 MED ORDER — FLUCONAZOLE 150 MG PO TABS: 150.0000 mg | ORAL_TABLET | Freq: Once | ORAL | 0 refills | Status: AC

## 2021-07-19 NOTE — Progress Notes (Signed)
Subjective:    Patient ID: Grace Beck, female    DOB: 1970-07-21, 51 y.o.   MRN: [id number]  HPI Patient is a very pleasant 51 year old Caucasian female who presents today complaining of feeling like she has monitor.  She states for approximately 1 week she has had a dull frontal headache directly above her eyes.  It is constant pressure-like headache.  She does have a history of migraines but she states that it feels different than her migraines.  She reports postnasal drip and drainage that is occasionally bloody.  She also has a sore scratchy throat.  However what has her concern is that she feels extremely fatigued similar to when she experienced mono in the past.  She took a negative COVID test.  She has not been exposed to strep for the flu to her knowledge.  She denies any cough shortness of breath or chest pain or pleurisy.  She denies any bleeding or bruising or night sweats. Past Medical History:  Diagnosis Date   Hepatic cyst    Migraines    Ovarian cyst    History reviewed. No pertinent surgical history. Current Outpatient Medications on File Prior to Visit  Medication Sig Dispense Refill   calcium gluconate 500 MG tablet Take 1 tablet by mouth 2 (two) times daily.     Cholecalciferol (VITAMIN D) 2000 units CAPS Take 2,000 Units by mouth daily.     Multiple Vitamins-Minerals (MULTIVITAMIN WITH MINERALS) tablet Take 1 tablet by mouth daily.     No current facility-administered medications on file prior to visit.   No Known Allergies Social History   Socioeconomic History   Marital status: Married    Spouse name: Not on file   Number of children: Not on file   Years of education: Not on file   Highest education level: Not on file  Occupational History   Not on file  Tobacco Use   Smoking status: Never   Smokeless tobacco: Never  Substance and Sexual Activity   Alcohol use: No   Drug use: No   Sexual activity: Not on file  Other Topics Concern   Not on file   Social History Narrative   Not on file   Social Determinants of Health   Financial Resource Strain: Not on file  Food Insecurity: Not on file  Transportation Needs: Not on file  Physical Activity: Not on file  Stress: Not on file  Social Connections: Not on file  Intimate Partner Violence: Not on file      Review of Systems  All other systems reviewed and are negative.     Objective:   Physical Exam Constitutional:      General: She is not in acute distress.    Appearance: She is well-developed and normal weight. She is not ill-appearing or toxic-appearing.  HENT:     Right Ear: Tympanic membrane and ear canal normal.     Left Ear: Tympanic membrane and ear canal normal.     Nose:     Right Sinus: Frontal sinus tenderness present.     Left Sinus: Frontal sinus tenderness present.  Eyes:     General: No scleral icterus.    Extraocular Movements: Extraocular movements intact.     Pupils: Pupils are equal, round, and reactive to light.  Cardiovascular:     Rate and Rhythm: Normal rate and regular rhythm.     Heart sounds: Normal heart sounds. No murmur heard.   No friction rub. No gallop.  Pulmonary:     Effort: Pulmonary effort is normal. No respiratory distress.     Breath sounds: Normal breath sounds. No wheezing, rhonchi or rales.  Abdominal:     General: Bowel sounds are normal.     Palpations: Abdomen is soft.  Skin:    Findings: No rash.  Neurological:     Mental Status: She is alert.          Assessment & Plan:   Fatigue, unspecified type - Plan: CMV abs, IgG+IgM (cytomegalovirus)  Acute non-recurrent frontal sinusitis - Plan: CBC with Differential/Platelet, COMPLETE METABOLIC PANEL WITH GFR, CMV abs, IgG+IgM (cytomegalovirus)  Pharyngitis, unspecified etiology - Plan: CMV abs, IgG+IgM (cytomegalovirus) Patient's COVID test was negative.  She has a history of mono.  She has no associated strep.  Symptoms do not sound consistent with.  Therefore I  felt that the patient most likely has CMV at this is a viral given the profound fatigue.  The fluid was also on the differential diagnosis.  The other possibility would be a sinus infection given the location and headache and the bloody postnasal drip and drainage.  I will treat the patient presumptively for sinus infection with Augmentin.  Meanwhile I will obtain lab work to evaluate for any other potential causes of the fatigue including a CBC, CMP, and CMV titers.  Recommend she avoid her parents who are very ill for at least another 2 to 3 days until she is feeling better which would be 10 days of current symptoms.

## 2021-07-20 LAB — CMV ABS, IGG+IGM (CYTOMEGALOVIRUS)
CMV IgM: <30 AU/mL
Cytomegalovirus Ab-IgG: <0.6 U/mL

## 2021-07-21 LAB — CBC WITH DIFFERENTIAL/PLATELET
Absolute Monocytes: 593 cells/uL (ref 200–950)
Basophils Absolute: 39 cells/uL (ref 0–200)
Basophils Relative: 0.5 %
Eosinophils Absolute: 69 cells/uL (ref 15–500)
Eosinophils Relative: 0.9 %
HCT: 39.9 % (ref 35.0–45.0)
Hemoglobin: 13.7 g/dL (ref 11.7–15.5)
Lymphs Abs: 2248 cells/uL (ref 850–3900)
MCH: 29.8 pg (ref 27.0–33.0)
MCHC: 34.3 g/dL (ref 32.0–36.0)
MCV: 86.9 fL (ref 80.0–100.0)
MPV: 11 fL (ref 7.5–12.5)
Monocytes Relative: 7.7 %
Neutro Abs: 4751 cells/uL (ref 1500–7800)
Neutrophils Relative %: 61.7 %
Platelets: 281 10*3/uL (ref 140–400)
RBC: 4.59 10*6/uL (ref 3.80–5.10)
RDW: 12.1 % (ref 11.0–15.0)
Total Lymphocyte: 29.2 %
WBC: 7.7 10*3/uL (ref 3.8–10.8)

## 2021-07-21 LAB — COMPLETE METABOLIC PANEL WITH GFR
AG Ratio: 1.4 (calc) (ref 1.0–2.5)
ALT: 9 U/L (ref 6–29)
AST: 12 U/L (ref 10–35)
Albumin: 4.2 g/dL (ref 3.6–5.1)
Alkaline phosphatase (APISO): 68 U/L (ref 31–125)
BUN: 14 mg/dL (ref 7–25)
CO2: 28 mmol/L (ref 20–32)
Calcium: 9.3 mg/dL (ref 8.6–10.2)
Chloride: 100 mmol/L (ref 98–110)
Creat: 0.9 mg/dL (ref 0.50–0.99)
Globulin: 2.9 g/dL (calc) (ref 1.9–3.7)
Glucose, Bld: 96 mg/dL (ref 65–99)
Potassium: 4.6 mmol/L (ref 3.5–5.3)
Sodium: 137 mmol/L (ref 135–146)
Total Bilirubin: 0.4 mg/dL (ref 0.2–1.2)
Total Protein: 7.1 g/dL (ref 6.1–8.1)
eGFR: 78 mL/min/{1.73_m2} (ref >=60)

## 2021-07-23 ENCOUNTER — Telehealth: Payer: Self-pay

## 2021-07-23 NOTE — Telephone Encounter (Signed)
-----   Message from Susy Frizzle, MD sent at 07/23/2021  6:38 AM EST ----- All labs are normal  How is she feeling?

## 2021-07-23 NOTE — Telephone Encounter (Signed)
Patient aware of results.

## 2022-03-27 LAB — HM MAMMOGRAPHY

## 2022-04-18 ENCOUNTER — Encounter: Payer: Self-pay | Admitting: Gastroenterology

## 2022-05-03 ENCOUNTER — Ambulatory Visit (AMBULATORY_SURGERY_CENTER): Payer: Self-pay

## 2022-05-03 VITALS — Ht 62.0 in | Wt 125.0 lb

## 2022-05-03 DIAGNOSIS — Z1211 Encounter for screening for malignant neoplasm of colon: Secondary | ICD-10-CM

## 2022-05-03 MED ORDER — NA SULFATE-K SULFATE-MG SULF 17.5-3.13-1.6 GM/177ML PO SOLN: 1.0000 | ORAL | 0 refills | Status: DC

## 2022-05-03 NOTE — Progress Notes (Signed)
No egg or soy allergy known to patient  No issues known to pt with past sedation with any surgeries or procedures Patient denies ever being told they had issues or difficulty with intubation  No FH of Malignant Hyperthermia Pt is not on diet pills Pt is not on  home 02  Pt is not on blood thinners  Pt denies issues with constipation  No A fib or A flutter Have any cardiac testing pending--denied Pt instructed to use Single[employer]com or GoodRx for a price reduction on prep   Patient's chart reviewed by Osvaldo Angst CNRA prior to previsit and patient appropriate for the [facility].  Previsit completed and red dot placed by patient's name on their procedure day (on provider's schedule).

## 2022-05-21 ENCOUNTER — Encounter: Payer: Self-pay | Admitting: Gastroenterology

## 2022-05-24 ENCOUNTER — Encounter: Payer: Self-pay | Admitting: Gastroenterology

## 2022-05-24 ENCOUNTER — Ambulatory Visit (AMBULATORY_SURGERY_CENTER): Payer: BC Managed Care – PPO | Admitting: Gastroenterology

## 2022-05-24 VITALS — BP 113/76 | HR 84 | Temp 96.8°F | Resp 14 | Ht 62.0 in | Wt 125.0 lb

## 2022-05-24 DIAGNOSIS — K635 Polyp of colon: Secondary | ICD-10-CM

## 2022-05-24 DIAGNOSIS — Z1211 Encounter for screening for malignant neoplasm of colon: Secondary | ICD-10-CM

## 2022-05-24 DIAGNOSIS — D125 Benign neoplasm of sigmoid colon: Secondary | ICD-10-CM

## 2022-05-24 MED ORDER — SODIUM CHLORIDE 0.9 % IV SOLN: 500.0000 mL | Freq: Once | INTRAVENOUS | Status: DC

## 2022-05-24 NOTE — Op Note (Signed)
[facility] Patient Name: Grace Beck Procedure Date: 05/24/2022 10:36 AM MRN: [id number] Endoscopist: Thornton Park MD, MD, [id number] Age: 51 Referring MD:  Date of Birth: [birth year] Gender: Female Account #: [id number] Procedure:                Colonoscopy Indications:              Screening for colorectal malignant neoplasm, This                            is the patient's first colonoscopy                           Mother may have had colon polyps Medicines:                Monitored Anesthesia Care Procedure:                Pre-Anesthesia Assessment:                           - Prior to the procedure, a History and Physical                            was performed, and patient medications and                            allergies were reviewed. The patient's tolerance of                            previous anesthesia was also reviewed. The risks                            and benefits of the procedure and the sedation                            options and risks were discussed with the patient.                            All questions were answered, and informed consent                            was obtained. Prior Anticoagulants: The patient has                            taken no anticoagulant or antiplatelet agents. ASA                            Grade Assessment: I - A normal, healthy patient.                            After reviewing the risks and benefits, the patient                            was deemed in satisfactory condition to undergo the  procedure.                           After obtaining informed consent, the colonoscope                            was passed under direct vision. Throughout the                            procedure, the patient's blood pressure, pulse, and                            oxygen saturations were monitored continuously. The                            Olympus CF-HQ190L [id number] [id number]) Colonoscope  was                            introduced through the anus and advanced to the 3                            cm into the ileum. A second forward view of the                            right colon was performed. The colonoscopy was                            performed without difficulty. The patient tolerated                            the procedure well. The quality of the bowel                            preparation was excellent. The terminal ileum,                            ileocecal valve, appendiceal orifice, and rectum                            were photographed. Scope In: 10:44:02 AM Scope Out: 10:56:05 AM Scope Withdrawal Time: 0 hours 10 minutes 40 seconds  Total Procedure Duration: 0 hours 12 minutes 3 seconds  Findings:                 The perianal and digital rectal examinations were                            normal.                           A 2 mm polyp was found in the sigmoid colon. The                            polyp was sessile. The polyp was removed with a  cold snare. Resection and retrieval were complete.                            Estimated blood loss was minimal.                           The exam was otherwise without abnormality on                            direct and retroflexion views. Complications:            No immediate complications. Estimated Blood Loss:     Estimated blood loss was minimal. Impression:               - One 2 mm polyp in the sigmoid colon, removed with                            a cold snare. Resected and retrieved.                           - The examination was otherwise normal on direct                            and retroflexion views. Recommendation:           - Patient has a contact number available for                            emergencies. The signs and symptoms of potential                            delayed complications were discussed with the                            patient. Return to normal  activities tomorrow.                            Written discharge instructions were provided to the                            patient.                           - Resume previous diet.                           - Continue present medications.                           - Await pathology results.                           - Repeat colonoscopy date to be determined after                            pending pathology results are reviewed for  surveillance.                           - Emerging evidence supports eating a diet of                            fruits, vegetables, grains, calcium, and yogurt                            while reducing red meat and alcohol may reduce the                            risk of colon cancer.                           - Thank you for allowing me to be involved in your                            colon cancer prevention. Thornton Park MD, MD 05/24/2022 11:03:04 AM This report has been signed electronically.

## 2022-05-24 NOTE — Progress Notes (Signed)
Report to PACU, RN, vss, BBS= Clear.  

## 2022-05-24 NOTE — Progress Notes (Signed)
Called to room to assist during endoscopic procedure.  Patient ID and intended procedure confirmed with present staff. Received instructions for my participation in the procedure from the performing physician.  

## 2022-05-24 NOTE — Patient Instructions (Signed)
Thank you for letting us take care of your healthcare needs today. Please see handouts given to you on Polyps.    YOU HAD AN ENDOSCOPIC PROCEDURE TODAY AT THE [facility]:   Refer to the procedure report that was given to you for any specific questions about what was found during the examination.  If the procedure report does not answer your questions, please call your gastroenterologist to clarify.  If you requested that your care partner not be given the details of your procedure findings, then the procedure report has been included in a sealed envelope for you to review at your convenience later.  YOU SHOULD EXPECT: Some feelings of bloating in the abdomen. Passage of more gas than usual.  Walking can help get rid of the air that was put into your GI tract during the procedure and reduce the bloating. If you had a lower endoscopy (such as a colonoscopy or flexible sigmoidoscopy) you may notice spotting of blood in your stool or on the toilet paper. If you underwent a bowel prep for your procedure, you may not have a normal bowel movement for a few days.  Please Note:  You might notice some irritation and congestion in your nose or some drainage.  This is from the oxygen used during your procedure.  There is no need for concern and it should clear up in a day or so.  SYMPTOMS TO REPORT IMMEDIATELY:  Following lower endoscopy (colonoscopy or flexible sigmoidoscopy):  Excessive amounts of blood in the stool  Significant tenderness or worsening of abdominal pains  Swelling of the abdomen that is new, acute  Fever of 100F or higher   For urgent or emergent issues, a gastroenterologist can be reached at any hour by calling [phone]. Do not use MyChart messaging for urgent concerns.    DIET:  We do recommend a small meal at first, but then you may proceed to your regular diet.  Drink plenty of fluids but you should avoid alcoholic beverages for 24 hours.  ACTIVITY:  You  should plan to take it easy for the rest of today and you should NOT DRIVE or use heavy machinery until tomorrow (because of the sedation medicines used during the test).    FOLLOW UP: Our staff will call the number listed on your records the next business day following your procedure.  We will call around 7:15- 8:00 am to check on you and address any questions or concerns that you may have regarding the information given to you following your procedure. If we do not reach you, we will leave a message.     If any biopsies were taken you will be contacted by phone or by letter within the next 1-3 weeks.  Please call us at [phone] if you have not heard about the biopsies in 3 weeks.    SIGNATURES/CONFIDENTIALITY: You and/or your care partner have signed paperwork which will be entered into your electronic medical record.  These signatures attest to the fact that that the information above on your After Visit Summary has been reviewed and is understood.  Full responsibility of the confidentiality of this discharge information lies with you and/or your care-partner. 

## 2022-05-24 NOTE — Progress Notes (Signed)
Pt's states no medical or surgical changes since previsit or office visit. VS assessed by D.T 

## 2022-05-24 NOTE — Progress Notes (Signed)
   Referring Provider: Everlene Farrier, MD Primary Care Physician:  Grace Frizzle, MD  Indication for Colonoscopy:  Colon cancer screening   IMPRESSION:  Need for colon cancer screening Appropriate candidate for monitored anesthesia care  PLAN: Colonoscopy in the [facility] today   HPI: Grace Beck is a 51 y.o. female presents for screening colonoscopy.  No prior colonoscopy or colon cancer screening.  No known family history of colon cancer or polyps. No family history of uterine/endometrial cancer, pancreatic cancer or gastric/stomach cancer.   Past Medical History:  Diagnosis Date   Hepatic cyst    Migraines    Ovarian cyst     Past Surgical History:  Procedure Laterality Date   WISDOM TOOTH EXTRACTION      Current Outpatient Medications  Medication Sig Dispense Refill   calcium gluconate 500 MG tablet Take 1 tablet by mouth 2 (two) times daily.     Cholecalciferol (VITAMIN D) 2000 units CAPS Take 2,000 Units by mouth daily.     cyanocobalamin 1000 MCG tablet Take 1,000 mcg by mouth daily.     levonorgestrel (MIRENA, 52 MG,) 20 MCG/DAY IUD Take by intrauterine route.     Multiple Vitamins-Minerals (MULTIVITAMIN WITH MINERALS) tablet Take 1 tablet by mouth daily.     Current Facility-Administered Medications  Medication Dose Route Frequency Provider Last Rate Last Admin   0.9 %  sodium chloride infusion  500 mL Intravenous Once Grace Park, MD        Allergies as of 05/24/2022   (No Known Allergies)    Family History  Problem Relation Age of Onset   Colon polyps Mother    Colon cancer Neg Hx    Esophageal cancer Neg Hx    Stomach cancer Neg Hx    Rectal cancer Neg Hx      Physical Exam: General:   Alert,  well-nourished, pleasant and cooperative in NAD Head:  Normocephalic and atraumatic. Eyes:  Sclera clear, no icterus.   Conjunctiva pink. Mouth:  No deformity or lesions.   Neck:  Supple; no masses or thyromegaly. Lungs:  Clear throughout  to auscultation.   No wheezes. Heart:  Regular rate and rhythm; no murmurs. Abdomen:  Soft, non-tender, nondistended, normal bowel sounds, no rebound or guarding.  Msk:  Symmetrical. No boney deformities LAD: No inguinal or umbilical LAD Extremities:  No clubbing or edema. Neurologic:  Alert and  oriented x4;  grossly nonfocal Skin:  No obvious rash or bruise. Psych:  Alert and cooperative. Normal mood and affect.     Studies/Results: No results found.    Grace Bugarin [name], MD, MPH 05/24/2022, 10:36 AM

## 2022-05-27 ENCOUNTER — Telehealth: Payer: Self-pay | Admitting: *Deleted

## 2022-05-27 NOTE — Telephone Encounter (Signed)
  Follow up Call-     05/24/2022   10:16 AM  Call back number  Post procedure Call Back phone  # [phone]  Permission to leave phone message Yes     Patient questions:  Do you have a fever, pain , or abdominal swelling? No. Pain Score  0 *  Have you tolerated food without any problems? Yes.    Have you been able to return to your normal activities? Yes.    Do you have any questions about your discharge instructions: Diet   No. Medications  No. Follow up visit  No.  Do you have questions or concerns about your Care? No.  Actions: * If pain score is 4 or above: No action needed, pain <4.

## 2022-05-28 NOTE — Telephone Encounter (Signed)
Thanks for the update. Path results are still pending from the polyp removed at the time of her colonoscopy. Given this history, would recommend surveillance colonoscopy in 5 years regardless of the pathology results. Thanks.

## 2022-06-03 ENCOUNTER — Encounter: Payer: Self-pay | Admitting: Gastroenterology

## 2022-07-29 ENCOUNTER — Other Ambulatory Visit: Payer: Self-pay | Admitting: Family Medicine

## 2022-07-29 DIAGNOSIS — Z1231 Encounter for screening mammogram for malignant neoplasm of breast: Secondary | ICD-10-CM

## 2022-10-24 ENCOUNTER — Other Ambulatory Visit: Payer: BC Managed Care – PPO

## 2022-10-24 DIAGNOSIS — Z13220 Encounter for screening for lipoid disorders: Secondary | ICD-10-CM

## 2022-10-24 DIAGNOSIS — R5383 Other fatigue: Secondary | ICD-10-CM

## 2022-10-25 LAB — LIPID PANEL
Cholesterol: 201 mg/dL — ABNORMAL HIGH (ref <200)
HDL: 41 mg/dL — ABNORMAL LOW (ref >=50)
LDL Cholesterol (Calc): 108 mg/dL (calc) — ABNORMAL HIGH
Non-HDL Cholesterol (Calc): 160 mg/dL (calc) — ABNORMAL HIGH (ref <130)
Total CHOL/HDL Ratio: 4.9 (calc) (ref <5.0)
Triglycerides: 365 mg/dL — ABNORMAL HIGH (ref <150)

## 2022-10-25 LAB — CBC WITH DIFFERENTIAL/PLATELET
Absolute Monocytes: 512 cells/uL (ref 200–950)
Basophils Absolute: 38 cells/uL (ref 0–200)
Basophils Relative: 0.6 %
Eosinophils Absolute: 102 cells/uL (ref 15–500)
Eosinophils Relative: 1.6 %
HCT: 42.7 % (ref 35.0–45.0)
Hemoglobin: 14.5 g/dL (ref 11.7–15.5)
Lymphs Abs: 1978 cells/uL (ref 850–3900)
MCH: 29.9 pg (ref 27.0–33.0)
MCHC: 34 g/dL (ref 32.0–36.0)
MCV: 88 fL (ref 80.0–100.0)
MPV: 11.5 fL (ref 7.5–12.5)
Monocytes Relative: 8 %
Neutro Abs: 3770 cells/uL (ref 1500–7800)
Neutrophils Relative %: 58.9 %
Platelets: 221 10*3/uL (ref 140–400)
RBC: 4.85 10*6/uL (ref 3.80–5.10)
RDW: 12.8 % (ref 11.0–15.0)
Total Lymphocyte: 30.9 %
WBC: 6.4 10*3/uL (ref 3.8–10.8)

## 2022-10-25 LAB — COMPLETE METABOLIC PANEL WITH GFR
AG Ratio: 1.7 (calc) (ref 1.0–2.5)
ALT: 14 U/L (ref 6–29)
AST: 14 U/L (ref 10–35)
Albumin: 4.4 g/dL (ref 3.6–5.1)
Alkaline phosphatase (APISO): 63 U/L (ref 37–153)
BUN: 9 mg/dL (ref 7–25)
CO2: 32 mmol/L (ref 20–32)
Calcium: 9.3 mg/dL (ref 8.6–10.4)
Chloride: 102 mmol/L (ref 98–110)
Creat: 0.75 mg/dL (ref 0.50–1.03)
Globulin: 2.6 g/dL (calc) (ref 1.9–3.7)
Glucose, Bld: 100 mg/dL — ABNORMAL HIGH (ref 65–99)
Potassium: 4.5 mmol/L (ref 3.5–5.3)
Sodium: 138 mmol/L (ref 135–146)
Total Bilirubin: 0.5 mg/dL (ref 0.2–1.2)
Total Protein: 7 g/dL (ref 6.1–8.1)
eGFR: 97 mL/min/{1.73_m2} (ref >=60)

## 2022-10-29 ENCOUNTER — Ambulatory Visit (INDEPENDENT_AMBULATORY_CARE_PROVIDER_SITE_OTHER): Payer: BC Managed Care – PPO | Admitting: Family Medicine

## 2022-10-29 ENCOUNTER — Encounter: Payer: Self-pay | Admitting: Family Medicine

## 2022-10-29 VITALS — BP 112/64 | HR 96 | Temp 98.6°F | Ht 62.0 in | Wt 131.2 lb

## 2022-10-29 DIAGNOSIS — Z0000 Encounter for general adult medical examination without abnormal findings: Secondary | ICD-10-CM

## 2022-10-29 DIAGNOSIS — Z0001 Encounter for general adult medical examination with abnormal findings: Secondary | ICD-10-CM | POA: Diagnosis not present

## 2022-10-29 DIAGNOSIS — H918X3 Other specified hearing loss, bilateral: Secondary | ICD-10-CM

## 2022-10-29 NOTE — Progress Notes (Signed)
Subjective:    Patient ID: Grace Beck, female    DOB: July 24, 1970, 52 y.o.   MRN: [id number]  HPI Patient is a very pleasant 52 year old Caucasian female who presents today for CPE.  She is under a lot of stress caring for her mother who has limited mobility and memory loss.  Her father also passed away.  Colonoscopy performed 11/23 revealed one polyp.  The patient has been noticing increasing hearing loss.  She states that she had numerous ear infections when she was a young child.  She believes she suffered hearing damage when she was younger.  However she has noticed significant decline in her hearing recently.  She is also noticing an increasing amount of tinnitus bilaterally.  She denies any vertigo.  She denies any headaches.  She denies any blurry vision.  She wears hearing protection.  She does not operate loud equipment to loud music.  She is very concerned about losing her hearing.  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  Her mammogram and her Pap smear both from September and are up-to-date.  Her most recent lab work as listed below.  Of note her father had hypertriglyceridemia as well Lab on 10/24/2022  Component Date Value Ref Range Status   WBC 10/24/2022 6.4  3.8 - 10.8 Thousand/uL Final   RBC 10/24/2022 4.85  3.80 - 5.10 Million/uL Final   Hemoglobin 10/24/2022 14.5  11.7 - 15.5 g/dL Final   HCT 40/98/1191 42.7  35.0 - 45.0 % Final   MCV 10/24/2022 88.0  80.0 - 100.0 fL Final   MCH 10/24/2022 29.9  27.0 - 33.0 pg Final   MCHC 10/24/2022 34.0  32.0 - 36.0 g/dL Final   RDW 47/82/9562 12.8  11.0 - 15.0 % Final   Platelets 10/24/2022 221  140 - 400 Thousand/uL Final   MPV 10/24/2022 11.5  7.5 - 12.5 fL Final   Neutro Abs 10/24/2022 3,770  1,500 - 7,800 cells/uL Final   Lymphs Abs 10/24/2022 1,978  850 - 3,900 cells/uL Final   Absolute Monocytes 10/24/2022 512  200 - 950 cells/uL Final   Eosinophils Absolute 10/24/2022 102  15 -  500 cells/uL Final   Basophils Absolute 10/24/2022 38  0 - 200 cells/uL Final   Neutrophils Relative % 10/24/2022 58.9  % Final   Total Lymphocyte 10/24/2022 30.9  % Final   Monocytes Relative 10/24/2022 8.0  % Final   Eosinophils Relative 10/24/2022 1.6  % Final   Basophils Relative 10/24/2022 0.6  % Final   Glucose, Bld 10/24/2022 100 (H)  65 - 99 mg/dL Final   Comment: .            Fasting reference interval . For someone without known diabetes, a glucose value between 100 and 125 mg/dL is consistent with prediabetes and should be confirmed with a follow-up test. .    BUN 10/24/2022 9  7 - 25 mg/dL Final   Creat 13/02/6577 0.75  0.50 - 1.03 mg/dL Final   eGFR 46/96/2952 97  > OR = 60 mL/min/1.2m2 Final   BUN/Creatinine Ratio 10/24/2022 SEE NOTE:  6 - 22 (calc) Final   Comment:    Not Reported: BUN and Creatinine are within    reference range. .    Sodium 10/24/2022 138  135 - 146 mmol/L Final   Potassium 10/24/2022 4.5  3.5 - 5.3 mmol/L Final   Chloride 10/24/2022 102  98 - 110 mmol/L Final  CO2 10/24/2022 32  20 - 32 mmol/L Final   Calcium 10/24/2022 9.3  8.6 - 10.4 mg/dL Final   Total Protein 16/04/9603 7.0  6.1 - 8.1 g/dL Final   Albumin 54/03/8118 4.4  3.6 - 5.1 g/dL Final   Globulin 14/78/2956 2.6  1.9 - 3.7 g/dL (calc) Final   AG Ratio 10/24/2022 1.7  1.0 - 2.5 (calc) Final   Total Bilirubin 10/24/2022 0.5  0.2 - 1.2 mg/dL Final   Alkaline phosphatase (APISO) 10/24/2022 63  37 - 153 U/L Final   AST 10/24/2022 14  10 - 35 U/L Final   ALT 10/24/2022 14  6 - 29 U/L Final   Cholesterol 10/24/2022 201 (H)  <200 mg/dL Final   HDL 21/30/8657 41 (L)  > OR = 50 mg/dL Final   Triglycerides 84/69/6295 365 (H)  <150 mg/dL Final   Comment: . If a non-fasting specimen was collected, consider repeat triglyceride testing on a fasting specimen if clinically indicated.  Perry Mount et al. J. of Clin. Lipidol. 2015;9:129-169. Marland Kitchen    LDL Cholesterol (Calc) 10/24/2022 108 (H)  mg/dL  (calc) Final   Comment: Reference range: <100 . Desirable range <100 mg/dL for primary prevention;   <70 mg/dL for patients with CHD or diabetic patients  with > or = 2 CHD risk factors. Marland Kitchen LDL-C is now calculated using the Martin-Hopkins  calculation, which is a validated novel method providing  better accuracy than the Friedewald equation in the  estimation of LDL-C.  Horald Pollen et al. Lenox Ahr. 2841;324(40): 2061-2068  (http://education.[facility].com/faq/FAQ164)    Total CHOL/HDL Ratio 10/24/2022 4.9  <1.0 (calc) Final   Non-HDL Cholesterol (Calc) 10/24/2022 160 (H)  <130 mg/dL (calc) Final   Comment: For patients with diabetes plus 1 major ASCVD risk  factor, treating to a non-HDL-C goal of <100 mg/dL  (LDL-C of <27 mg/dL) is considered a therapeutic  option.     Past Medical History:  Diagnosis Date   Hepatic cyst    Migraines    Ovarian cyst    Past Surgical History:  Procedure Laterality Date   WISDOM TOOTH EXTRACTION     Current Outpatient Medications on File Prior to Visit  Medication Sig Dispense Refill   calcium gluconate 500 MG tablet Take 1 tablet by mouth 2 (two) times daily.     Cholecalciferol (VITAMIN D) 2000 units CAPS Take 2,000 Units by mouth daily.     cyanocobalamin 1000 MCG tablet Take 1,000 mcg by mouth daily.     levonorgestrel (MIRENA, 52 MG,) 20 MCG/DAY IUD Take by intrauterine route.     Multiple Vitamins-Minerals (MULTIVITAMIN WITH MINERALS) tablet Take 1 tablet by mouth daily.     No current facility-administered medications on file prior to visit.   No Known Allergies Social History   Socioeconomic History   Marital status: Married    Spouse name: Not on file   Number of children: Not on file   Years of education: Not on file   Highest education level: Not on file  Occupational History   Not on file  Tobacco Use   Smoking status: Never   Smokeless tobacco: Never  Substance and Sexual Activity   Alcohol use: No   Drug use: No    Sexual activity: Not on file  Other Topics Concern   Not on file  Social History Narrative   Not on file   Social Determinants of Health   Financial Resource Strain: Not on file  Food Insecurity: Not on file  Transportation Needs: Not on file  Physical Activity: Not on file  Stress: Not on file  Social Connections: Not on file  Intimate Partner Violence: Not on file      Review of Systems  All other systems reviewed and are negative.      Objective:   Physical Exam Constitutional:      General: She is not in acute distress.    Appearance: Normal appearance. She is well-developed and normal weight. She is not ill-appearing or toxic-appearing.  HENT:     Head: Normocephalic and atraumatic.     Right Ear: Tympanic membrane, ear canal and external ear normal. There is no impacted cerumen.     Left Ear: Tympanic membrane, ear canal and external ear normal. There is no impacted cerumen.     Nose: No congestion or rhinorrhea.     Left Sinus: No frontal sinus tenderness.     Mouth/Throat:     Mouth: Mucous membranes are moist.     Pharynx: Oropharynx is clear. No oropharyngeal exudate or posterior oropharyngeal erythema.  Eyes:     General: No scleral icterus.    Extraocular Movements: Extraocular movements intact.     Pupils: Pupils are equal, round, and reactive to light.  Neck:     Vascular: No carotid bruit.  Cardiovascular:     Rate and Rhythm: Normal rate and regular rhythm.     Heart sounds: Normal heart sounds. No murmur heard.    No friction rub. No gallop.  Pulmonary:     Effort: Pulmonary effort is normal. No respiratory distress.     Breath sounds: Normal breath sounds. No stridor. No wheezing, rhonchi or rales.  Abdominal:     General: Bowel sounds are normal. There is no distension.     Palpations: Abdomen is soft.     Tenderness: There is no abdominal tenderness. There is no guarding or rebound.  Musculoskeletal:     Cervical back: Neck supple. No  rigidity.     Right lower leg: No edema.     Left lower leg: No edema.  Lymphadenopathy:     Cervical: No cervical adenopathy.  Skin:    Findings: No rash.  Neurological:     General: No focal deficit present.     Mental Status: She is alert and oriented to person, place, and time. Mental status is at baseline.     Cranial Nerves: No cranial nerve deficit.     Motor: No weakness.     Coordination: Coordination normal.     Gait: Gait normal.  Psychiatric:        Mood and Affect: Mood normal.        Behavior: Behavior normal.        Thought Content: Thought content normal.        Judgment: Judgment normal.     Lab on 10/24/2022  Component Date Value Ref Range Status   WBC 10/24/2022 6.4  3.8 - 10.8 Thousand/uL Final   RBC 10/24/2022 4.85  3.80 - 5.10 Million/uL Final   Hemoglobin 10/24/2022 14.5  11.7 - 15.5 g/dL Final   HCT 96/10/5407 42.7  35.0 - 45.0 % Final   MCV 10/24/2022 88.0  80.0 - 100.0 fL Final   MCH 10/24/2022 29.9  27.0 - 33.0 pg Final   MCHC 10/24/2022 34.0  32.0 - 36.0 g/dL Final   RDW 81/19/1478 12.8  11.0 - 15.0 % Final   Platelets 10/24/2022 221  140 - 400 Thousand/uL Final   MPV  10/24/2022 11.5  7.5 - 12.5 fL Final   Neutro Abs 10/24/2022 3,770  1,500 - 7,800 cells/uL Final   Lymphs Abs 10/24/2022 1,978  850 - 3,900 cells/uL Final   Absolute Monocytes 10/24/2022 512  200 - 950 cells/uL Final   Eosinophils Absolute 10/24/2022 102  15 - 500 cells/uL Final   Basophils Absolute 10/24/2022 38  0 - 200 cells/uL Final   Neutrophils Relative % 10/24/2022 58.9  % Final   Total Lymphocyte 10/24/2022 30.9  % Final   Monocytes Relative 10/24/2022 8.0  % Final   Eosinophils Relative 10/24/2022 1.6  % Final   Basophils Relative 10/24/2022 0.6  % Final   Glucose, Bld 10/24/2022 100 (H)  65 - 99 mg/dL Final   Comment: .            Fasting reference interval . For someone without known diabetes, a glucose value between 100 and 125 mg/dL is consistent with prediabetes  and should be confirmed with a follow-up test. .    BUN 10/24/2022 9  7 - 25 mg/dL Final   Creat 16/04/9603 0.75  0.50 - 1.03 mg/dL Final   eGFR 54/03/8118 97  > OR = 60 mL/min/1.51m2 Final   BUN/Creatinine Ratio 10/24/2022 SEE NOTE:  6 - 22 (calc) Final   Comment:    Not Reported: BUN and Creatinine are within    reference range. .    Sodium 10/24/2022 138  135 - 146 mmol/L Final   Potassium 10/24/2022 4.5  3.5 - 5.3 mmol/L Final   Chloride 10/24/2022 102  98 - 110 mmol/L Final   CO2 10/24/2022 32  20 - 32 mmol/L Final   Calcium 10/24/2022 9.3  8.6 - 10.4 mg/dL Final   Total Protein 14/78/2956 7.0  6.1 - 8.1 g/dL Final   Albumin 21/30/8657 4.4  3.6 - 5.1 g/dL Final   Globulin 84/69/6295 2.6  1.9 - 3.7 g/dL (calc) Final   AG Ratio 10/24/2022 1.7  1.0 - 2.5 (calc) Final   Total Bilirubin 10/24/2022 0.5  0.2 - 1.2 mg/dL Final   Alkaline phosphatase (APISO) 10/24/2022 63  37 - 153 U/L Final   AST 10/24/2022 14  10 - 35 U/L Final   ALT 10/24/2022 14  6 - 29 U/L Final   Cholesterol 10/24/2022 201 (H)  <200 mg/dL Final   HDL 28/41/3244 41 (L)  > OR = 50 mg/dL Final   Triglycerides 07/10/7251 365 (H)  <150 mg/dL Final   Comment: . If a non-fasting specimen was collected, consider repeat triglyceride testing on a fasting specimen if clinically indicated.  Perry Mount et al. J. of Clin. Lipidol. 2015;9:129-169. Marland Kitchen    LDL Cholesterol (Calc) 10/24/2022 108 (H)  mg/dL (calc) Final   Comment: Reference range: <100 . Desirable range <100 mg/dL for primary prevention;   <70 mg/dL for patients with CHD or diabetic patients  with > or = 2 CHD risk factors. Marland Kitchen LDL-C is now calculated using the Martin-Hopkins  calculation, which is a validated novel method providing  better accuracy than the Friedewald equation in the  estimation of LDL-C.  Horald Pollen et al. Lenox Ahr. 6644;034(74): 2061-2068  (http://education.[facility].com/faq/FAQ164)    Total CHOL/HDL Ratio 10/24/2022 4.9  <2.5 (calc)  Final   Non-HDL Cholesterol (Calc) 10/24/2022 160 (H)  <130 mg/dL (calc) Final   Comment: For patients with diabetes plus 1 major ASCVD risk  factor, treating to a non-HDL-C goal of <100 mg/dL  (LDL-C of <95 mg/dL) is considered a therapeutic  option.  Assessment & Plan:  General medical exam  Other specified hearing loss of both ears - Plan: Ambulatory referral to Audiology We discussed the shingles vaccine and the patient politely defers that at the present time.  Mammogram, Pap smear, and colonoscopy are up-to-date.  I recommended 2000 mg a day of omega-3 fatty acids and recheck her cholesterol in 3 to 4 months.  Also recommended a low-carb diet and try to reduce her consumption of fried food.  I will consult audiology due to the hearing loss.  I believe the tinnitus is related to the hearing loss however the physical exam today shows no explanation for the hearing loss.  I believe that this is likely sensorineural hearing loss.

## 2022-11-14 ENCOUNTER — Ambulatory Visit: Payer: BC Managed Care – PPO | Attending: Audiologist | Admitting: Audiologist

## 2022-11-14 DIAGNOSIS — H903 Sensorineural hearing loss, bilateral: Secondary | ICD-10-CM | POA: Insufficient documentation

## 2022-11-14 DIAGNOSIS — H9313 Tinnitus, bilateral: Secondary | ICD-10-CM | POA: Diagnosis present

## 2022-11-15 NOTE — Procedures (Signed)
Outpatient Audiology and [facility] [address] [place], [place]  [address] [phone]  AUDIOLOGICAL  EVALUATION  NAME: Grace Beck     DOB:   January 08, 1971      MRN: [id number]                                                                                     DATE: 11/15/2022     REFERENT: Donita Brooks, MD STATUS: Outpatient DIAGNOSIS: Tinnitus, Mild Sensorineural Hearing Loss    History: [name] was seen for an audiological evaluation.  [name] is receiving a hearing evaluation due to concerns for difficulty hearing that has slowly progressed. [name] has difficulty hearing in noise places. This difficulty began gradually getting worse over the last year. No pain or pressure reported in either ear. Tinnitus constant in both ears and can be bothersome at night. 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  Medical history negative for a condition which is a risk factor for hearing loss. No other relevant case history reported.   Evaluation:  Otoscopy showed a clear view of the tympanic membranes, bilaterally Tympanometry results were consistent with normal middle ear function, bilaterally   Audiometric testing was completed using conventional audiometry with supraural and insert transducer. Speech Recognition Thresholds were 15dB in the right ear and 15dB in the left ear. Word Recognition was performed  40dB SL, scored 100% in the right ear and 100% in the left ear. Pure tone thresholds show normal hearing 250-3kHz sloping to a slight sensorineural hearing loss in the right ear and a mild sensorineural hearing loss in the left ear 4-8kHZ. Left ear 10dB worse 4-8kHz.   Tinnitus matched to 4kHz pure tone at 28dB. On scale of 1-10 with 10 perfect match, she rated this tone an 8. Tinnitus likely from noise exposure. Quick Speech in Noise Test (QuickSIN):  list of six sentences with five key words per sentence is presented in four-talker babble noise. The  sentences are presented at pre- recorded signal-to-noise ratios which decrease in 5-dB steps from 25 (very easy) to 0 (extremely difficult). The SNRs used are: 25, 20, 15, 10, 5 and 0, encompassing normal to severely impaired performance in noise. Taxes binaural separation and discrimination skills. [name] performed in mild loss range for both ears.   Results:  The test results were reviewed with [name]. She has a mild high frequency sensorineural hearing loss with the left ear slightly worse. She is not a hearing aid candidate. Her tinnitus was matched to [facility] which is where damage from noise exposure occurs. 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  Recommendations: Due to high frequency sensorineural hearing loss, recommend annual audiometric testing to check for progression.  Use OTC amplifiers if she needs help hearing in noise. Handout provided to patient about OTC aids.  Use masking at night to lessen awareness of tinnitus. Packet of information about tinnitus and masking given to [name].    32 minutes spent testing and counseling on results.   [name]  Audiologist, Au.D., CCC-A 11/15/2022  8:37 AM  Cc: Donita Brooks, MD

## 2023-03-31 LAB — HM MAMMOGRAPHY

## 2023-04-02 LAB — HM PAP SMEAR

## 2023-04-25 ENCOUNTER — Other Ambulatory Visit: Payer: BC Managed Care – PPO

## 2023-04-25 DIAGNOSIS — Z13220 Encounter for screening for lipoid disorders: Secondary | ICD-10-CM

## 2023-04-25 DIAGNOSIS — R5383 Other fatigue: Secondary | ICD-10-CM

## 2023-04-26 LAB — CBC WITH DIFFERENTIAL/PLATELET
Absolute Lymphocytes: 2024 {cells}/uL (ref 850–3900)
Absolute Monocytes: 440 {cells}/uL (ref 200–950)
Basophils Absolute: 22 {cells}/uL (ref 0–200)
Basophils Relative: 0.4 %
Eosinophils Absolute: 110 {cells}/uL (ref 15–500)
Eosinophils Relative: 2 %
HCT: 42.1 % (ref 35.0–45.0)
Hemoglobin: 14.1 g/dL (ref 11.7–15.5)
MCH: 29.7 pg (ref 27.0–33.0)
MCHC: 33.5 g/dL (ref 32.0–36.0)
MCV: 88.8 fL (ref 80.0–100.0)
MPV: 11.3 fL (ref 7.5–12.5)
Monocytes Relative: 8 %
Neutro Abs: 2904 {cells}/uL (ref 1500–7800)
Neutrophils Relative %: 52.8 %
Platelets: 215 10*3/uL (ref 140–400)
RBC: 4.74 10*6/uL (ref 3.80–5.10)
RDW: 12.2 % (ref 11.0–15.0)
Total Lymphocyte: 36.8 %
WBC: 5.5 10*3/uL (ref 3.8–10.8)

## 2023-04-26 LAB — COMPLETE METABOLIC PANEL WITH GFR
AG Ratio: 1.9 (calc) (ref 1.0–2.5)
ALT: 15 U/L (ref 6–29)
AST: 15 U/L (ref 10–35)
Albumin: 4.4 g/dL (ref 3.6–5.1)
Alkaline phosphatase (APISO): 52 U/L (ref 37–153)
BUN: 13 mg/dL (ref 7–25)
CO2: 29 mmol/L (ref 20–32)
Calcium: 9.3 mg/dL (ref 8.6–10.4)
Chloride: 103 mmol/L (ref 98–110)
Creat: 0.74 mg/dL (ref 0.50–1.03)
Globulin: 2.3 g/dL (ref 1.9–3.7)
Glucose, Bld: 94 mg/dL (ref 65–99)
Potassium: 4.1 mmol/L (ref 3.5–5.3)
Sodium: 139 mmol/L (ref 135–146)
Total Bilirubin: 0.5 mg/dL (ref 0.2–1.2)
Total Protein: 6.7 g/dL (ref 6.1–8.1)
eGFR: 99 mL/min/{1.73_m2} (ref >=60)

## 2023-04-26 LAB — LIPID PANEL
Cholesterol: 196 mg/dL (ref <200)
HDL: 50 mg/dL (ref >=50)
LDL Cholesterol (Calc): 109 mg/dL — ABNORMAL HIGH
Non-HDL Cholesterol (Calc): 146 mg/dL — ABNORMAL HIGH (ref <130)
Total CHOL/HDL Ratio: 3.9 (calc) (ref <5.0)
Triglycerides: 245 mg/dL — ABNORMAL HIGH (ref <150)

## 2023-10-31 ENCOUNTER — Encounter: Payer: Self-pay | Admitting: Family Medicine

## 2023-10-31 ENCOUNTER — Ambulatory Visit: Payer: BC Managed Care – PPO | Admitting: Family Medicine

## 2023-10-31 VITALS — BP 116/66 | HR 88 | Temp 98.1°F | Ht 62.0 in | Wt 126.0 lb

## 2023-10-31 DIAGNOSIS — R5383 Other fatigue: Secondary | ICD-10-CM | POA: Diagnosis not present

## 2023-10-31 DIAGNOSIS — Z8639 Personal history of other endocrine, nutritional and metabolic disease: Secondary | ICD-10-CM | POA: Diagnosis not present

## 2023-10-31 DIAGNOSIS — E781 Pure hyperglyceridemia: Secondary | ICD-10-CM | POA: Diagnosis not present

## 2023-10-31 DIAGNOSIS — Z0001 Encounter for general adult medical examination with abnormal findings: Secondary | ICD-10-CM | POA: Diagnosis not present

## 2023-10-31 DIAGNOSIS — Z0000 Encounter for general adult medical examination without abnormal findings: Secondary | ICD-10-CM

## 2023-10-31 NOTE — Progress Notes (Signed)
 Subjective:    Patient ID: Grace Beck, female    DOB: [birth year], 53 y.o.   MRN: [id number]  HPI Patient is a very pleasant 53 year old Caucasian female who presents today for CPE.  Since her last visit, her mother has passed away.  She lost her father the year before.  Colonoscopy performed 11/23 revealed one polyp.   Patient does report fatigue.  She attributes some of this to going through menopause.  She also attributes some of this to having the recent loss of her parents and all the stress associated and caring for them as she was essentially spending every night in their home for many months.  She denies any chest pain or shortness of breath or dyspnea on exertion.  She denies any melena or hematochezia.  She denies any weight loss.  She does have a history of vitamin D deficiency and is taking 2000 units a day of vitamin D.  Her mammogram was performed in September 2024 and is up-to-date.  Her Pap smear was performed in September 2024 and is up-to-date.  She is due for the shingles vaccine, the pneumonia vaccine, and a tetanus shot denies these today. Past Medical History:  Diagnosis Date   Hepatic cyst    Migraines    Ovarian cyst    Past Surgical History:  Procedure Laterality Date   WISDOM TOOTH EXTRACTION     Current Outpatient Medications on File Prior to Visit  Medication Sig Dispense Refill   calcium gluconate 500 MG tablet Take 1 tablet by mouth 2 (two) times daily.     Cholecalciferol (VITAMIN D) 2000 units CAPS Take 2,000 Units by mouth daily.     cyanocobalamin 1000 MCG tablet Take 1,000 mcg by mouth daily.     levonorgestrel (MIRENA, 52 MG,) 20 MCG/DAY IUD Take by intrauterine route.     Multiple Vitamins-Minerals (MULTIVITAMIN WITH MINERALS) tablet Take 1 tablet by mouth daily.     No current facility-administered medications on file prior to visit.   No Known Allergies Social History   Socioeconomic History   Marital status: Married    Spouse name: Not  on file   Number of children: Not on file   Years of education: Not on file   Highest education level: Bachelor's degree (e.g., BA, AB, BS)  Occupational History   Not on file  Tobacco Use   Smoking status: Never   Smokeless tobacco: Never  Substance and Sexual Activity   Alcohol use: No   Drug use: No   Sexual activity: Not on file  Other Topics Concern   Not on file  Social History Narrative   Not on file   Social Drivers of Health   Financial Resource Strain: Low Risk  (10/24/2023)   Overall Financial Resource Strain (CARDIA)    Difficulty of Paying Living Expenses: Not hard at all  Food Insecurity: No Food Insecurity (10/24/2023)   Hunger Vital Sign    Worried About Running Out of Food in the Last Year: Never true    Ran Out of Food in the Last Year: Never true  Transportation Needs: No Transportation Needs (10/24/2023)   PRAPARE - Administrator, Civil Service (Medical): No    Lack of Transportation (Non-Medical): No  Physical Activity: Not on file  Stress: No Stress Concern Present (10/24/2023)   [employer] of Occupational Health - Occupational Stress Questionnaire    Feeling of Stress : Not at all  Social Connections: Socially Integrated (10/24/2023)  Social Advertising account executive [NHANES]    Frequency of Communication with Friends and Family: More than three times a week    Frequency of Social Gatherings with Friends and Family: More than three times a week    Attends Religious Services: More than 4 times per year    Active Member of [employer] or Organizations: Yes    Attends Banker Meetings: 1 to 4 times per year    Marital Status: Married  Catering manager Violence: Not on file      Review of Systems  All other systems reviewed and are negative.      Objective:   Physical Exam Constitutional:      General: She is not in acute distress.    Appearance: Normal appearance. She is well-developed and normal weight. She is  not ill-appearing or toxic-appearing.  HENT:     Head: Normocephalic and atraumatic.     Right Ear: Tympanic membrane, ear canal and external ear normal. There is no impacted cerumen.     Left Ear: Tympanic membrane, ear canal and external ear normal. There is no impacted cerumen.     Nose: No congestion or rhinorrhea.     Left Sinus: No frontal sinus tenderness.     Mouth/Throat:     Mouth: Mucous membranes are moist.     Pharynx: Oropharynx is clear. No oropharyngeal exudate or posterior oropharyngeal erythema.  Eyes:     General: No scleral icterus.    Extraocular Movements: Extraocular movements intact.     Pupils: Pupils are equal, round, and reactive to light.  Neck:     Vascular: No carotid bruit.  Cardiovascular:     Rate and Rhythm: Normal rate and regular rhythm.     Heart sounds: Normal heart sounds. No murmur heard.    No friction rub. No gallop.  Pulmonary:     Effort: Pulmonary effort is normal. No respiratory distress.     Breath sounds: Normal breath sounds. No stridor. No wheezing, rhonchi or rales.  Abdominal:     General: Bowel sounds are normal. There is no distension.     Palpations: Abdomen is soft.     Tenderness: There is no abdominal tenderness. There is no guarding or rebound.  Musculoskeletal:     Cervical back: Neck supple. No rigidity.     Right lower leg: No edema.     Left lower leg: No edema.  Lymphadenopathy:     Cervical: No cervical adenopathy.  Skin:    Findings: No rash.  Neurological:     General: No focal deficit present.     Mental Status: She is alert and oriented to person, place, and time. Mental status is at baseline.     Cranial Nerves: No cranial nerve deficit.     Motor: No weakness.     Coordination: Coordination normal.     Gait: Gait normal.  Psychiatric:        Mood and Affect: Mood normal.        Behavior: Behavior normal.        Thought Content: Thought content normal.        Judgment: Judgment normal.             Assessment & Plan:  General medical exam - Plan: CBC with Differential/Platelet, COMPLETE METABOLIC PANEL WITHOUT GFR, Lipid panel  Hypertriglyceridemia - Plan: CBC with Differential/Platelet, COMPLETE METABOLIC PANEL WITHOUT GFR, Lipid panel  History of vitamin D  deficiency - Plan: VITAMIN D  25 Hydroxy (  Vit-D Deficiency, Fractures)  Other fatigue - Plan: VITAMIN D 25 Hydroxy (Vit-D Deficiency, Fractures), TSH Patient defers the shingles vaccine, the pneumonia vaccine, and a tetanus shot.  If she changes her mind she is welcome to these at any time.  Colonoscopy, mammogram, and Pap smear are up-to-date.  I will check a CBC, CMP, lipid panel, TSH, and a vitamin D level.  If triglycerides remain elevated I would recommend switching to prescription Lovaza.  We discussed hormone replacement and at the present time the patient politely declines this.

## 2023-11-01 LAB — CBC WITH DIFFERENTIAL/PLATELET
Absolute Lymphocytes: 1841 {cells}/uL (ref 850–3900)
Absolute Monocytes: 448 {cells}/uL (ref 200–950)
Basophils Absolute: 38 {cells}/uL (ref 0–200)
Basophils Relative: 0.7 %
Eosinophils Absolute: 59 {cells}/uL (ref 15–500)
Eosinophils Relative: 1.1 %
HCT: 43.4 % (ref 35.0–45.0)
Hemoglobin: 14.5 g/dL (ref 11.7–15.5)
MCH: 29.6 pg (ref 27.0–33.0)
MCHC: 33.4 g/dL (ref 32.0–36.0)
MCV: 88.6 fL (ref 80.0–100.0)
MPV: 11.7 fL (ref 7.5–12.5)
Monocytes Relative: 8.3 %
Neutro Abs: 3013 {cells}/uL (ref 1500–7800)
Neutrophils Relative %: 55.8 %
Platelets: 243 10*3/uL (ref 140–400)
RBC: 4.9 10*6/uL (ref 3.80–5.10)
RDW: 12.8 % (ref 11.0–15.0)
Total Lymphocyte: 34.1 %
WBC: 5.4 10*3/uL (ref 3.8–10.8)

## 2023-11-01 LAB — LIPID PANEL
Cholesterol: 219 mg/dL — ABNORMAL HIGH (ref <200)
HDL: 46 mg/dL — ABNORMAL LOW (ref >=50)
LDL Cholesterol (Calc): 139 mg/dL — ABNORMAL HIGH
Non-HDL Cholesterol (Calc): 173 mg/dL — ABNORMAL HIGH (ref <130)
Total CHOL/HDL Ratio: 4.8 (calc) (ref <5.0)
Triglycerides: 207 mg/dL — ABNORMAL HIGH (ref <150)

## 2023-11-01 LAB — COMPLETE METABOLIC PANEL WITHOUT GFR
AG Ratio: 1.8 (calc) (ref 1.0–2.5)
ALT: 16 U/L (ref 6–29)
AST: 16 U/L (ref 10–35)
Albumin: 4.4 g/dL (ref 3.6–5.1)
Alkaline phosphatase (APISO): 77 U/L (ref 37–153)
BUN: 12 mg/dL (ref 7–25)
CO2: 29 mmol/L (ref 20–32)
Calcium: 9.5 mg/dL (ref 8.6–10.4)
Chloride: 104 mmol/L (ref 98–110)
Creat: 0.69 mg/dL (ref 0.50–1.03)
Globulin: 2.4 g/dL (ref 1.9–3.7)
Glucose, Bld: 102 mg/dL — ABNORMAL HIGH (ref 65–99)
Potassium: 4.6 mmol/L (ref 3.5–5.3)
Sodium: 139 mmol/L (ref 135–146)
Total Bilirubin: 0.4 mg/dL (ref 0.2–1.2)
Total Protein: 6.8 g/dL (ref 6.1–8.1)

## 2023-11-01 LAB — VITAMIN D 25 HYDROXY (VIT D DEFICIENCY, FRACTURES): Vit D, 25-Hydroxy: 51 ng/mL (ref 30–100)

## 2023-11-01 LAB — TSH: TSH: 1.13 m[IU]/L

## 2023-11-03 ENCOUNTER — Other Ambulatory Visit: Payer: Self-pay | Admitting: Family Medicine

## 2023-11-03 MED ORDER — OMEGA-3-ACID ETHYL ESTERS 1 G PO CAPS: 1.0000 g | ORAL_CAPSULE | Freq: Two times a day (BID) | ORAL | 3 refills | Status: AC

## 2023-11-19 ENCOUNTER — Encounter: Payer: Self-pay | Admitting: Family Medicine

## 2023-11-20 ENCOUNTER — Other Ambulatory Visit: Payer: Self-pay | Admitting: Family Medicine

## 2023-11-20 DIAGNOSIS — H918X3 Other specified hearing loss, bilateral: Secondary | ICD-10-CM

## 2023-12-11 ENCOUNTER — Ambulatory Visit: Attending: Family Medicine | Admitting: Audiologist

## 2023-12-11 DIAGNOSIS — H903 Sensorineural hearing loss, bilateral: Secondary | ICD-10-CM | POA: Insufficient documentation

## 2023-12-11 NOTE — Procedures (Signed)
  Outpatient Audiology and [facility] [address] [place], [place]  [address] [phone]  AUDIOLOGICAL  EVALUATION  NAME: Grace Beck     DOB:   October 23, 1970      MRN: [id number]                                                                                     DATE: 12/11/2023     REFERENT: Austine Lefort, MD STATUS: Outpatient DIAGNOSIS: Asymmetric Sensorineural Hearing Loss    History: Grace Beck was seen for an audiological evaluation to monitor her hearing for progression. Grace Beck feels her hearing has declined since her 2024 hearing test. She is needing people to repeat more. Her tinnitus is most bothersome at night. The tinnitus is constant in both ears.  Grace Beck denies pain or pressure in either ear. 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  Medical history shows no additional risk for hearing loss.    Evaluation:  Otoscopy showed a clear view of the tympanic membranes, bilaterally Tympanometry results were consistent with normal middle ear function, bilaterally   Audiometric testing was completed using Conventional Audiometry techniques with insert earphones and supraural headphones. Test results are consistent with normal hearing 250-2kHz with normal hearing in the right ear 3-8kHz and mild to moderate asymmetric sensorineural hearing loss in the left ear. Speech Recognition Thresholds were obtained at 15 dB HL in the right ear and at 15 dB HL in the left ear. Word Recognition Testing was completed at  40dB SL and Grace Beck scored 100% in each ear.  Quick[employer]IN showed normal ability to hear in noise.    Results:  The test results were reviewed with Grace Beck. Her hearing has changed in the left ear only. This warrants a medical evaluation.  Audiogram printed and provided to [name].     Recommendations: Recommend referral to Otolaryngology due to  asymmetric progression of hearing loss.   28 minutes spent testing and counseling on results.    If you have any questions please feel free to contact me at [phone].  Raynald Calkins Stalnaker Au.D.  Audiologist   12/11/2023  4:28 PM  Cc: Austine Lefort, MD

## 2024-01-22 ENCOUNTER — Encounter: Payer: Self-pay | Admitting: Family Medicine

## 2024-01-23 ENCOUNTER — Telehealth: Payer: Self-pay | Admitting: Audiologist

## 2024-01-23 ENCOUNTER — Other Ambulatory Visit: Payer: Self-pay | Admitting: Family Medicine

## 2024-01-23 DIAGNOSIS — H918X3 Other specified hearing loss, bilateral: Secondary | ICD-10-CM

## 2024-01-23 NOTE — Telephone Encounter (Signed)
 Grace Beck called to ask about referral to Otolaryngology. This would need to be put in by a medical provider, audiology cannot place referrals.  The request for Otolaryngology due to asymmetric loss will be sent again to PCP.  Lauraine Netta Luria AuD Audiologist

## 2024-04-05 NOTE — Progress Notes (Unsigned)
  [address], [address] [place], [place] [address] [phone]  Audiological Evaluation    Name: Grace Beck     DOB:   [birth year]      MRN:   [id number]                                                                                     Service Date: 04/05/2024     Accompanied by: unaccompanied    Patient comes today after Dr. Karis, ENT sent a referral for a hearing evaluation due to concerns with hearing loss asymmetry.   Symptoms Yes Details  Hearing loss  [x]  06/16/2072: Audiometric testing was completed using Conventional Audiometry techniques with insert earphones and supraural headphones. Test results are consistent with normal hearing 250-2kHz with normal hearing in the right ear 3-8kHz and mild to moderate asymmetric sensorineural hearing loss in the left ear. Speech Recognition Thresholds were obtained at 15 dB HL in the right ear and at 15 dB HL in the left ear. Word Recognition Testing was completed at  40dB SL and Helana scored 100% in each ear.   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 Left ear:  Clear external ear canal and notable landmarks visualized on the tympanic membrane.  Tympanometry: Right ear: Type A- Normal external ear canal volume with normal middle ear pressure and tympanic membrane compliance. Left ear: Type A- Normal external ear canal volume with normal middle ear pressure and tympanic membrane compliance.   Pure tone Audiometry: Right ear- Normal to borderline normal hearing  from 250 Hz - 8000 Hz. Left ear-  Normal hearing from [phone] Hz, then mild  sensorineural hearing loss from 3000 Hz - 8000 Hz.  Speech  Audiometry: Right ear- Speech Reception Threshold (SRT) was obtained at 10 dBHL. Left ear-Speech Reception Threshold (SRT) was obtained at 10 dBHL.   Word Recognition Score Tested using NU-6 (recorded) Right ear: 100% was obtained at a presentation level of 50 dBHL with contralateral masking which is deemed as  excellent. Left ear: 100% was obtained at a presentation level of 50 dBHL with contralateral masking which is deemed as  excellent.   The hearing test results were completed under inserts and results are deemed to be of good reliability. Test technique:  conventional    Impression: There  significant difference in pure-tone thresholds between ears, worse in the left ear continues to be observed.   Recommendations: Follow up with ENT as scheduled for today. Return for a hearing evaluation if concerns with hearing changes arise or per MD recommendation. Consider a communication needs assessment after medical clearance for hearing aids is obtained.   Shown Dissinger MARIE LEROUX-MARTINEZ, AUD

## 2024-04-06 ENCOUNTER — Ambulatory Visit (INDEPENDENT_AMBULATORY_CARE_PROVIDER_SITE_OTHER): Admitting: Audiology

## 2024-04-06 ENCOUNTER — Ambulatory Visit (INDEPENDENT_AMBULATORY_CARE_PROVIDER_SITE_OTHER): Admitting: Otolaryngology

## 2024-04-06 ENCOUNTER — Encounter (INDEPENDENT_AMBULATORY_CARE_PROVIDER_SITE_OTHER): Payer: Self-pay | Admitting: Otolaryngology

## 2024-04-06 VITALS — BP 118/80 | HR 90 | Temp 98.2°F | Ht 61.5 in | Wt 127.0 lb

## 2024-04-06 DIAGNOSIS — H9313 Tinnitus, bilateral: Secondary | ICD-10-CM

## 2024-04-06 DIAGNOSIS — H903 Sensorineural hearing loss, bilateral: Secondary | ICD-10-CM | POA: Diagnosis not present

## 2024-04-06 DIAGNOSIS — H9319 Tinnitus, unspecified ear: Secondary | ICD-10-CM

## 2024-04-06 DIAGNOSIS — H9042 Sensorineural hearing loss, unilateral, left ear, with unrestricted hearing on the contralateral side: Secondary | ICD-10-CM

## 2024-04-07 ENCOUNTER — Telehealth (INDEPENDENT_AMBULATORY_CARE_PROVIDER_SITE_OTHER): Payer: Self-pay

## 2024-04-07 DIAGNOSIS — H903 Sensorineural hearing loss, bilateral: Secondary | ICD-10-CM | POA: Insufficient documentation

## 2024-04-07 DIAGNOSIS — H9313 Tinnitus, bilateral: Secondary | ICD-10-CM | POA: Insufficient documentation

## 2024-04-07 NOTE — Progress Notes (Signed)
 CC: Bilateral hearing loss, bilateral tinnitus  HPI:  Grace Beck is a 52 y.o. female who presents today complaining of bilateral hearing loss and tinnitus for the past year.  According to the patient, her hearing loss and tinnitus are worse on the left side.  She denies any otalgia, otorrhea, or vertigo.  She was recently evaluated at [facility].  She was noted to have bilateral mild high-frequency sensorineural hearing loss, worse on the left side.  She was referred for evaluation due to her asymmetry.  The patient denies any recent otitis media or otitis externa.  She has no previous otologic surgery.  Past Medical History:  Diagnosis Date   Hepatic cyst    Hypertriglyceridemia    Migraines    Ovarian cyst     Past Surgical History:  Procedure Laterality Date   WISDOM TOOTH EXTRACTION      Family History  Problem Relation Age of Onset   Colon polyps Mother    Colon cancer Neg Hx    Esophageal cancer Neg Hx    Stomach cancer Neg Hx    Rectal cancer Neg Hx     Social History:  reports that she has never smoked. She has never used smokeless tobacco. She reports that she does not drink alcohol and does not use drugs.  Allergies: No Known Allergies  Prior to Admission medications   Medication Sig Start Date End Date Taking? Authorizing Provider  calcium gluconate 500 MG tablet Take 1 tablet by mouth 2 (two) times daily.   Yes [provider]  Cholecalciferol (VITAMIN D ) 2000 units CAPS Take 2,000 Units by mouth daily.   Yes [provider]  cyanocobalamin 1000 MCG tablet Take 1,000 mcg by mouth daily.   Yes [provider]  levonorgestrel (MIRENA, 52 MG,) 20 MCG/DAY IUD Take by intrauterine route.   Yes [provider]  Multiple Vitamins-Minerals (MULTIVITAMIN WITH MINERALS) tablet Take 1 tablet by mouth daily.   Yes [provider]  omega-3 acid ethyl esters (LOVAZA ) 1 g capsule Take 1 capsule (1 g total) by mouth  2 (two) times daily. 11/03/23  Yes Duanne Butler DASEN, MD    Blood pressure 118/80, pulse 90, temperature 98.2 F (36.8 C), temperature source Oral, height 5' 1.5 (1.562 m), weight 127 lb (57.6 kg), SpO2 98%. Exam: General: Communicates without difficulty, well nourished, no acute distress. Head: Normocephalic, no evidence injury, no tenderness, facial buttresses intact without stepoff. Face/sinus: No tenderness to palpation and percussion. Facial movement is normal and symmetric. Eyes: PERRL, EOMI. No scleral icterus, conjunctivae clear. Neuro: CN II exam reveals vision grossly intact.  No nystagmus at any point of gaze. Ears: Auricles well formed without lesions.  Ear canals are intact without mass or lesion.  No erythema or edema is appreciated.  The TMs are intact without fluid. Nose: External evaluation reveals normal support and skin without lesions.  Dorsum is intact.  Anterior rhinoscopy reveals normal mucosa over anterior aspect of inferior turbinates and intact septum.  No purulence noted. Oral:  Oral cavity and oropharynx are intact, symmetric, without erythema or edema.  Mucosa is moist without lesions. Neck: Full range of motion without pain.  There is no significant lymphadenopathy.  No masses palpable.  Thyroid bed within normal limits to palpation.  Parotid glands and submandibular glands equal bilaterally without mass.  Trachea is midline. Neuro:  CN 2-12 grossly intact.   Her hearing test shows bilateral mild high-frequency sensorineural hearing loss, worse on the  left side.  Assessment: 1.  Bilateral mild high-frequency sensorineural hearing loss, worse on the left side. 2.  Her tinnitus is likely a result of the hearing loss. 3.  Her ear canals, tympanic membranes, and middle ear spaces are normal.  Plan: 1.  The physical exam findings and the hearing test results are reviewed with the patient. 2.  The small possibility of a retrocochlear lesion causing the asymmetric hearing loss  is discussed.  The options of conservative observation versus MRI scan are reviewed.  The patient would like to proceed with the MRI scan. 3.  The strategies to cope with tinnitus, including the use of masker, hearing aids, tinnitus retraining therapy, and avoidance of caffeine and alcohol are discussed. 4.  The patient will return for reevaluation in 1 year, sooner if abnormalities are noted on the MRI scan.  Grace Beck W Grace Beck 04/07/2024, 11:34 AM

## 2024-04-07 NOTE — Telephone Encounter (Signed)
 Pt called concerned about her MRI that Karis wants her to get done, Pt stated she is extremely claustrophobic and wants to know if an open MRI is an option

## 2024-04-08 ENCOUNTER — Encounter (INDEPENDENT_AMBULATORY_CARE_PROVIDER_SITE_OTHER): Payer: Self-pay

## 2024-04-09 ENCOUNTER — Telehealth (INDEPENDENT_AMBULATORY_CARE_PROVIDER_SITE_OTHER): Payer: Self-pay

## 2024-04-09 NOTE — Telephone Encounter (Signed)
 Patient called regarding cost for Brain MRI. Patient states that for their insurance it would be better if she went through [facility]. She is requesting a call back. The number is [phone].

## 2024-04-12 ENCOUNTER — Telehealth (INDEPENDENT_AMBULATORY_CARE_PROVIDER_SITE_OTHER): Payer: Self-pay

## 2024-04-12 ENCOUNTER — Other Ambulatory Visit (INDEPENDENT_AMBULATORY_CARE_PROVIDER_SITE_OTHER): Payer: Self-pay | Admitting: Otolaryngology

## 2024-04-12 ENCOUNTER — Ambulatory Visit (HOSPITAL_COMMUNITY): Admission: RE | Admit: 2024-04-12 | Source: Ambulatory Visit

## 2024-04-12 DIAGNOSIS — H9042 Sensorineural hearing loss, unilateral, left ear, with unrestricted hearing on the contralateral side: Secondary | ICD-10-CM

## 2024-04-12 NOTE — Telephone Encounter (Signed)
 A Lou from [facility] called about info about a scan Teoh requested for this patient. He stated he wants to order faxed to them at [phone] and he also wants a call back at [phone] for additional details.

## 2024-04-13 NOTE — Telephone Encounter (Signed)
Patient seen yesterday by provider

## 2024-10-28 ENCOUNTER — Other Ambulatory Visit

## 2024-11-01 ENCOUNTER — Encounter: Admitting: Family Medicine
